# Patient Record
Sex: Male | Born: 1955 | Race: White | Hispanic: No | Smoking: Former smoker
Health system: Southern US, Community
[De-identification: ages and names within clinical notes are randomized; demographics above are authoritative.]

## PROBLEM LIST (undated history)

## (undated) DIAGNOSIS — J42 Unspecified chronic bronchitis: Secondary | ICD-10-CM

## (undated) DIAGNOSIS — M1990 Unspecified osteoarthritis, unspecified site: Secondary | ICD-10-CM

## (undated) DIAGNOSIS — I10 Essential (primary) hypertension: Secondary | ICD-10-CM

## (undated) HISTORY — DX: Unspecified osteoarthritis, unspecified site: M19.90

## (undated) HISTORY — PX: TOTAL HIP ARTHROPLASTY: SHX124

## (undated) HISTORY — PX: DENTAL SURGERY: SHX609

---

## 2004-10-15 ENCOUNTER — Emergency Department: Payer: Self-pay | Admitting: Emergency Medicine

## 2004-10-18 ENCOUNTER — Emergency Department: Payer: Self-pay | Admitting: Emergency Medicine

## 2004-10-20 ENCOUNTER — Emergency Department (HOSPITAL_COMMUNITY): Admission: EM | Admit: 2004-10-20 | Discharge: 2004-10-20 | Payer: Self-pay | Admitting: Emergency Medicine

## 2004-10-21 ENCOUNTER — Emergency Department (HOSPITAL_COMMUNITY): Admission: EM | Admit: 2004-10-21 | Discharge: 2004-10-21 | Payer: Self-pay | Admitting: Emergency Medicine

## 2005-09-29 ENCOUNTER — Emergency Department: Payer: Self-pay | Admitting: Emergency Medicine

## 2005-10-09 ENCOUNTER — Ambulatory Visit: Payer: Self-pay | Admitting: Gastroenterology

## 2007-08-23 ENCOUNTER — Emergency Department (HOSPITAL_COMMUNITY): Admission: EM | Admit: 2007-08-23 | Discharge: 2007-08-23 | Payer: Self-pay | Admitting: Emergency Medicine

## 2007-08-24 ENCOUNTER — Observation Stay (HOSPITAL_COMMUNITY): Admission: EM | Admit: 2007-08-24 | Discharge: 2007-08-25 | Payer: Self-pay | Admitting: Emergency Medicine

## 2010-07-10 NOTE — Discharge Summary (Signed)
Riley Anderson, Riley Anderson                  ACCOUNT NO.:  [id number]   MEDICAL RECORD NO.:  [id number]          PATIENT TYPE:  OBV   LOCATION:  5158                         FACILITY:  [facility]   PHYSICIAN:  Mick Sell, MD DATE OF BIRTH:  [birth year]   DATE OF ADMISSION:  08/24/2007  DATE OF DISCHARGE:  08/25/2007                               DISCHARGE SUMMARY   DISCHARGE DIAGNOSES:  1. Cellulitis on the left hand with abscess positive for      Staphylococcus aureus methicillin sensitive.  2. Tobacco abuse.  3. History of hypertension.   DISCHARGE MEDICATIONS:  1. NicoDerm 21 mg patch apply 1 patch under skin every 24 hours.  2. Doxycycline 100 mcg p.o. b.i.d.  3. Percocet 7.5/325/325 1-2 tablets by mouth every 6 hours as needed      for pain.   DISPOSITION AND FOLLOW-UP:  The patient had been discharged in stable  condition with a follow-up appointment scheduled with primary care  physician, Dr. [name] on September 01, 2007, at 10:30 in the morning and  that appointment is going to be really important to recheck the  patient's left hand to reassure that he is healing properly and also in  order for him to reestablish his relationship with primary care  physician.  Important to recheck blood pressure and adjust medications  in case that he needs to be on any.   PROCEDURES PERFORMED:  The patient had an incision and drainage of left  hand abscess.  Procedure was performed by Dr. Sampson Goon, and also  myself was attending on this procedure. There were no other procedures  performed during this admission.  There were no consultations history.   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  The patient had an incision and drainage on the  emergency department on Sunday, and he was sent home with doxycycline,  but the pain was so intense that the patient returned on Monday morning  with swelling getting into mid forearm and with real red, so the patient  was at this point admitted in order to give IV antibiotics, gave  medications for his pain and also in order to have another incision and  drainage plus culture of the absence in order to try to isolate  unspecific microorganisms.  The patient denies fever, denies chills,  denies nausea and has history of chronic cough secondary to smoking  habit.   PHYSICAL EXAMINATION:  VITAL SIGNS:  Temperature 97.6, blood pressure  125/86, heart rate 81, respiratory rate 14, oxygen saturation 99% on  room air.  GENERAL:  The patient was in no acute distress.  EYES:  PERRLA, no icterus, no nystagmus.  Extraocular muscles intact.  ENT:  Moist mucous membrane.  Oropharynx without erythema or plaques.  The patient with poor dentition.  Neck:  Supple.  No thyromegaly, no lymphadenopathy palpated.  No bruits.  RESPIRATORY SYSTEM:  Clear to auscultation bilaterally.  CARDIOVASCULAR:  Regular rate and rhythm, no murmur, gallops or rubs.  ABDOMEN:  Soft, positive bowel sounds, nontender, nondistended.  EXTREMITIES:  Regarding extremity exam, lower extremity showing no edema  or pain, no clubbing, no cyanosis.  On the left hand, the patient has a  4 x 4 cm abscess with minimal fluctuation plus erythema of his entire  hand up to his wrist and edema up to forearm.  The range of motion of  his left wrist is decreased secondary to the edema and the pain.  NEUROLOGIC:  The patient was alert, awake and oriented x3.  Neurologic  exam grossly nonfocal, completely normal, with cranial nerve II-XII  completely intact.  Finger-to-nose normal and muscle strength except for  the decreasing of the wrist extension 5/5  bilaterally in the 4 limbs.   LABS:  Sodium 137, potassium 4.7, chloride 104, bicarb 76, BUN 12,  creatinine 1.05, glucose 121, white blood cell of 12.2, hemoglobin 14,  platelet 264.  The patient has a bilirubin of 1.1, alkaline phosphatase  92, AST 21, ALT 17, albumin 4.3, calcium 9.2.  Culture abscess showed  nothing on the gram stain.  Culture was positive for moderate staph  aureus resistant to tetracycline.  It was MSSA.   HOSPITAL COURSE:  1. Cellulitis of the left hand and abscess.  The patient was admitted      and received IV vancomycin after we had the culture and he was      admitted pretty much for just pain control and 1 day of antibiotic      and in order to perform a second incision and drainage.  As soon as      this was done, the culture was back showing the sensitivity of the      microorganism.  The patient was switched from vancomycin to      doxycycline 100 mg by mouth twice a day in order for him to      complete a course of 14 days.  The patient is going to be followed      by primary care physician, Dr. [name] on September 01, 2007, at 10:30      in the morning.  2. Tobacco abuse.  The patient received counseling for smoking      cessation and he was also offered nicotine patch to use in order to      help him stop smoking.  After the discussion, the patient agreed to      use patch and decided to go with a prescription as an outpatient to      continue using the patch and try to stop smoking.  3. The history of hypertension.  The patient's blood pressure during      this whole hospitalization have been completely in the normal      range.  He is taking no medications at all.  We decided to schedule      a follow-up appointment with primary care physician where he is      noted to only have the cellulitis of his left hand and abscess      checkup.  He is going also to reestablish his medical physician      relationship for regular follow-up and his blood pressure  is going  to be checked again, in case that this elevated, the patient is      going to receive the appropriate treatment.   At the moment of discharge, the patient vital signs, temperature 97.6,  blood pressure 135/88, heart rate 75, respiratory rate 16, oxygen  saturation 98 on room air.  White blood cells 8.1, hemoglobin 13.4,  platelet 239.  We have a sodium 139, potassium 4.2, chloride 106, bicarb  30, BUN 11, creatinine 1.0, glucose 116. The patient was discharged in  stable condition with instructions to complete p.o. antibiotics for 14  days and also instructions to use Percocet for pain control.      Rosanna Randy, MD  Electronically Signed      Mick Sell, MD  Electronically Signed   CEM/MEDQ  D:  08/25/2007  T:  08/26/2007  Job:  [id number] [id number]   cc:   Loma Sender

## 2010-11-22 LAB — CULTURE, ROUTINE-ABSCESS: Gram Stain: NONE SEEN

## 2010-11-22 LAB — DIFFERENTIAL
Lymphocytes Relative: 15
Lymphs Abs: 1.9
Monocytes Absolute: 1.1 — ABNORMAL HIGH
Monocytes Relative: 9
Neutrophils Relative %: 72

## 2010-11-22 LAB — COMPREHENSIVE METABOLIC PANEL
AST: 21
Albumin: 4.3
GFR calc Af Amer: >60
Glucose, Bld: 121 — ABNORMAL HIGH
Potassium: 4.7

## 2010-11-22 LAB — BASIC METABOLIC PANEL
CO2: 30
Calcium: 9
Chloride: 106
GFR calc Af Amer: >60
GFR calc non Af Amer: >60
Glucose, Bld: 116 — ABNORMAL HIGH
Potassium: 4.2
Sodium: 139

## 2010-11-22 LAB — CBC
HCT: 38.9 — ABNORMAL LOW
HCT: 40.6
Hemoglobin: 14
MCHC: 34.4
MCHC: 34.5
MCV: 89.8
MCV: 90.3
RDW: 13.5
WBC: 8.1

## 2012-03-25 ENCOUNTER — Emergency Department: Payer: Self-pay | Admitting: Emergency Medicine

## 2012-03-28 ENCOUNTER — Emergency Department (HOSPITAL_COMMUNITY): Payer: Self-pay

## 2012-03-28 ENCOUNTER — Encounter (HOSPITAL_COMMUNITY): Payer: Self-pay | Admitting: Emergency Medicine

## 2012-03-28 ENCOUNTER — Emergency Department (HOSPITAL_COMMUNITY)
Admission: EM | Admit: 2012-03-28 | Discharge: 2012-03-28 | Disposition: A | Discharge status: Home Health | Payer: Self-pay | Attending: Emergency Medicine | Admitting: Emergency Medicine

## 2012-03-28 DIAGNOSIS — K047 Periapical abscess without sinus: Secondary | ICD-10-CM | POA: Insufficient documentation

## 2012-03-28 DIAGNOSIS — F17200 Nicotine dependence, unspecified, uncomplicated: Secondary | ICD-10-CM | POA: Insufficient documentation

## 2012-03-28 LAB — POCT I-STAT, CHEM 8
BUN: 20 mg/dL (ref 6–23)
Calcium, Ion: 1.24 mmol/L — ABNORMAL HIGH (ref 1.12–1.23)
Chloride: 105 mEq/L (ref 96–112)
Creatinine, Ser: 1.1 mg/dL (ref 0.50–1.35)
HCT: 43 % (ref 39.0–52.0)
Potassium: 4.1 mEq/L (ref 3.5–5.1)

## 2012-03-28 MED ORDER — IOHEXOL 300 MG/ML  SOLN
100.0000 mL | Freq: Once | INTRAMUSCULAR | Status: AC | PRN
  Administered 2012-03-28: 100 mL via INTRAVENOUS

## 2012-03-28 MED ORDER — METRONIDAZOLE 500 MG PO TABS: 500.0000 mg | ORAL_TABLET | Freq: Three times a day (TID) | ORAL | Status: DC

## 2012-03-28 MED ORDER — PENICILLIN V POTASSIUM 500 MG PO TABS: 500.0000 mg | ORAL_TABLET | Freq: Four times a day (QID) | ORAL | Status: AC

## 2012-03-28 MED ORDER — OXYCODONE-ACETAMINOPHEN 5-325 MG PO TABS: 2.0000 | ORAL_TABLET | ORAL | Status: DC | PRN

## 2012-03-28 NOTE — ED Provider Notes (Signed)
History     CSN: [id number]  Arrival date & time 03/28/12  1147   First MD Initiated Contact with Patient 03/28/12 1210      Chief Complaint  Patient presents with  . Oral Swelling    (Consider location/radiation/quality/duration/timing/severity/associated sxs/prior treatment) HPI Comments: Pain and swelling to the left jaw for the past week. Denies trauma. Saw dentist and is placed on amoxicillin she's been taking for the past week. he states is not working. No difficulty breathing or swallowing. No fevers, chills or vomiting. No chest pain or shortness of breath.  The history is provided by the patient.    History reviewed. No pertinent past medical history.  History reviewed. No pertinent past surgical history.  No family history on file.  History  Substance Use Topics  . Smoking status: Current Some Day Smoker -- 1.0 packs/day    Types: Cigarettes  . Smokeless tobacco: Not on file  . Alcohol Use: No      Review of Systems  Constitutional: Negative for fever.  HENT: Positive for facial swelling and dental problem. Negative for trouble swallowing, neck pain and neck stiffness.   Respiratory: Negative for cough and shortness of breath.   Cardiovascular: Negative for chest pain.  Gastrointestinal: Negative for nausea, vomiting and abdominal pain.  A complete 10 system review of systems was obtained and all systems are negative except as noted in the HPI and PMH.    Allergies  Other and Sudafed  Home Medications  No current outpatient prescriptions on file.  BP 139/81  Pulse 84  Temp 98 F (36.7 C) (Oral)  Resp 20  SpO2 100%  Physical Exam  Constitutional: He is oriented to person, place, and time. He appears well-developed. No distress.  HENT:  Head: Normocephalic and atraumatic.    Mouth/Throat: Oropharynx is clear and moist. No oropharyngeal exudate.       Firm 2 cm tender nodule to the angle of the left jaw. Patient has no teeth in the upper jaw and  only incisors in the lower jaw. Floor of mouth is soft. There is no trismus protime elevation. Controlling secretions. No palpable fluctuance inside the mouth.  Eyes: Conjunctivae normal and EOM are normal. Pupils are equal, round, and reactive to light.  Neck: Normal range of motion. Neck supple.       No meningismus  Cardiovascular: Normal rate, regular rhythm and normal heart sounds.   No murmur heard. Pulmonary/Chest: Effort normal and breath sounds normal. No respiratory distress.  Abdominal: Soft. There is no tenderness. There is no rebound and no guarding.  Musculoskeletal: Normal range of motion. He exhibits no edema and no tenderness.  Neurological: He is alert and oriented to person, place, and time. No cranial nerve deficit. Coordination normal.  Skin: Skin is warm.    ED Course  Procedures (including critical care time)  Labs Reviewed  POCT I-STAT, CHEM 8 - Abnormal; Notable for the following:    Glucose, Bld 108 (*)     Calcium, Ion 1.24 (*)     All other components within normal limits   Ct Maxillofacial W/cm  03/28/2012  *RADIOLOGY REPORT*  Clinical Data: Pain and swelling  CT MAXILLOFACIAL WITH CONTRAST  Technique:  Multidetector CT imaging of the maxillofacial structures was performed with intravenous contrast. Multiplanar CT image reconstructions were also generated.  Contrast: OMNIPAQUE IOHEXOL 300 MG/ML  SOLN  Comparison: None.  Findings: Periapical abscess involving tooth number 20 with erosion through the mandible and small subperiosteal abscess.  There is a somewhat irregular peripheral enhancing 2.3 cm deep subcutaneous abscess anterior to the left masseter musculature.  There is mild edema in the left masseter musculature.  No adenopathy is evident.  The paranasal sinuses are normally developed well aerated.  Temporomandibular joints seated. Retention cysts or polyps in bilateral maxillary sinuses. Visualized intracranial contents unremarkable.  Orbits and globes  unremarkable.  Multiple missing teeth and dental caries.  IMPRESSION:  1.  Periapical abscess of tooth number 20 complicated by subperiosteal and deep subcutaneous abscess as above.   Original Report Authenticated By: D. Andria Rhein, MD      No diagnosis found.    MDM  Apparent dental abscess without systemic symptoms. No evidence of Ludwig's angina.  Patient's vitals are stable. He is controlling his secretions he has no difficulty breathing or swallowing.  Imaging remarkable for periApical abscess tooth 20 with periosteal and deep subcutaneous abscess.  Discussed with Dr. Chales Salmon. He recommends adding Flagyl to patient's amoxicillin. He will followup the patient in the office on Monday for drainage.      Glynn Octave, MD 03/28/12 1426

## 2012-03-28 NOTE — ED Notes (Signed)
Pt. Stated, i have this place on my rt. Jaw for a week and it started cause I had a bad tooth . I'm taking an antibiotic for it but its not working.  It moves around and i'm worried about what it might do.

## 2012-03-28 NOTE — ED Notes (Signed)
Pt discharged to home with family 

## 2013-08-27 IMAGING — CT CT MAXILLOFACIAL W/ CM
1 series · 16 of 30 positions shown, 20 images · IV contrast (omnipaque)
Comparison: None.

CLINICAL DATA: Pain and swelling

CT MAXILLOFACIAL WITH CONTRAST
TECHNIQUE: Multidetector CT imaging of the maxillofacial
structures was performed with intravenous contrast. Multiplanar CT
image reconstructions were also generated.
Contrast: 100mL OMNIPAQUE IOHEXOL 300 MG/ML  SOLN

[Series 4: orbit/facial 2.0 h30s · axial · 0.37mm/px · z∈[-312,-154]mm · 16 of 85 slices shown, 20 images]
[im 3/85  brain]
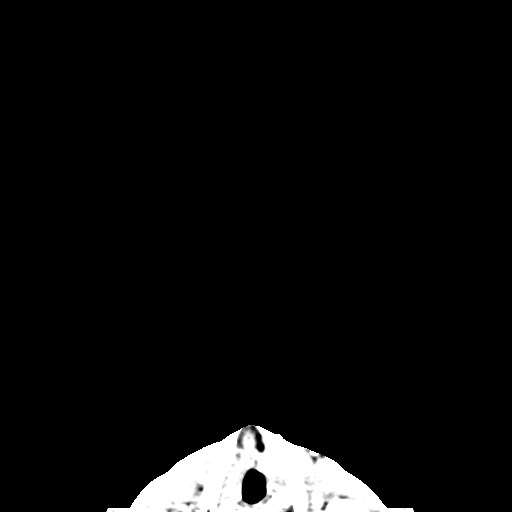
[im 3/85  bone]
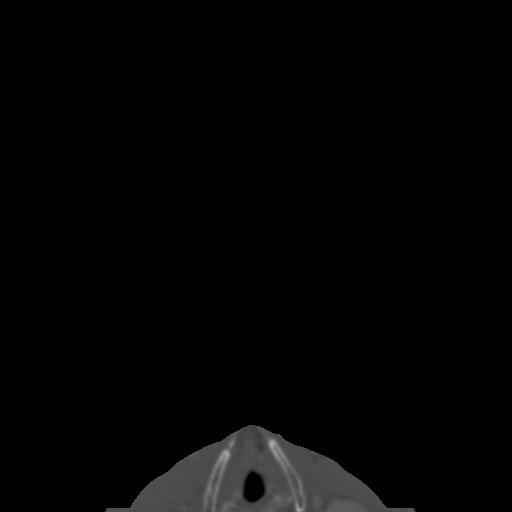
[im 9/85  bone]
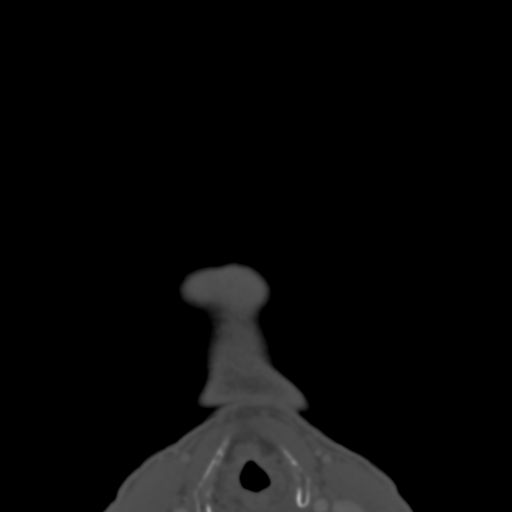
[im 15/85  bone]
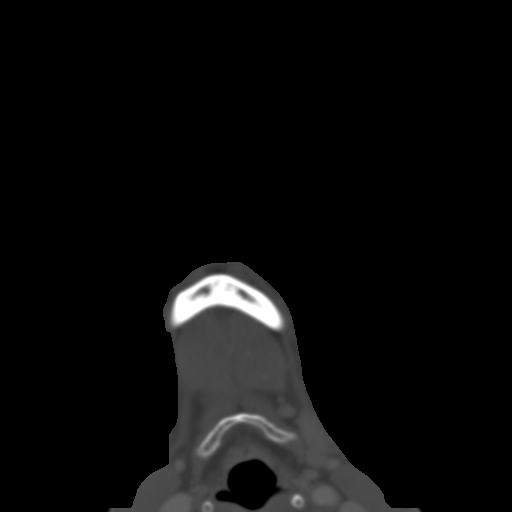
[im 21/85  bone]
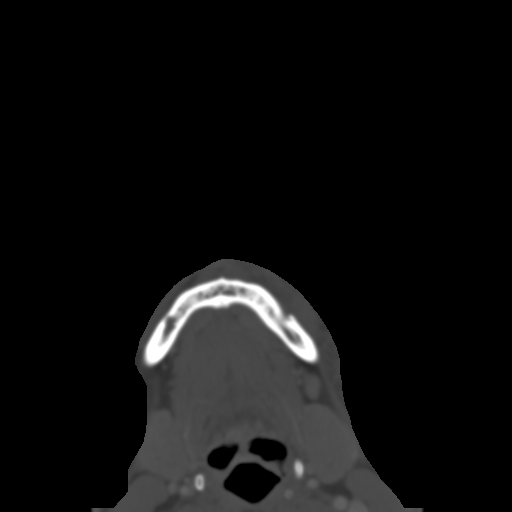
[im 24/85  brain]
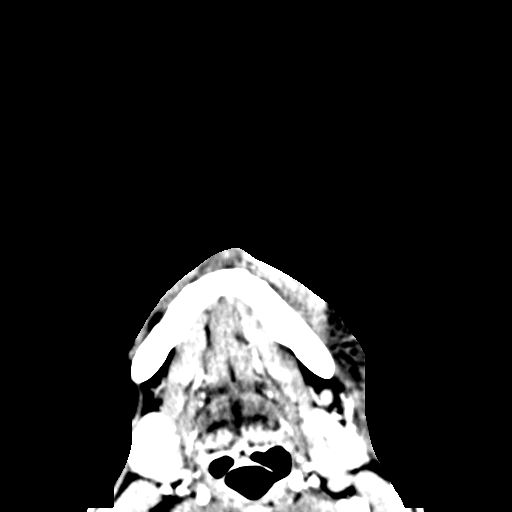
[im 24/85  bone]
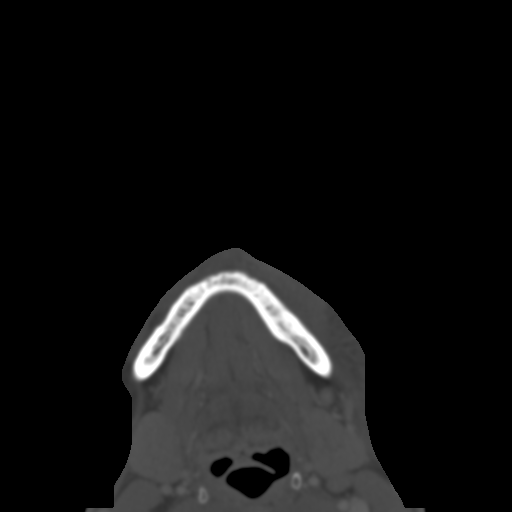
[im 29/85  bone]
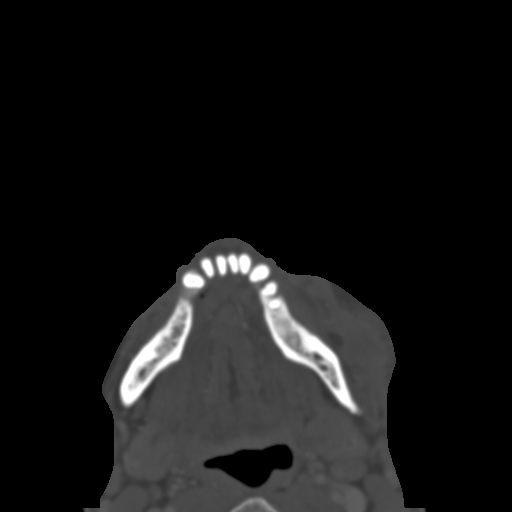
[im 35/85  bone]
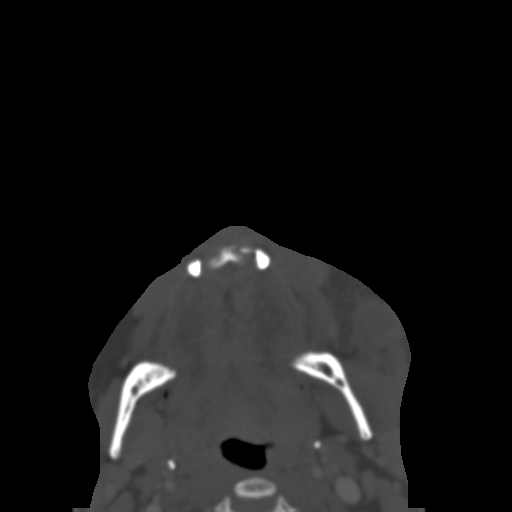
[im 41/85  bone]
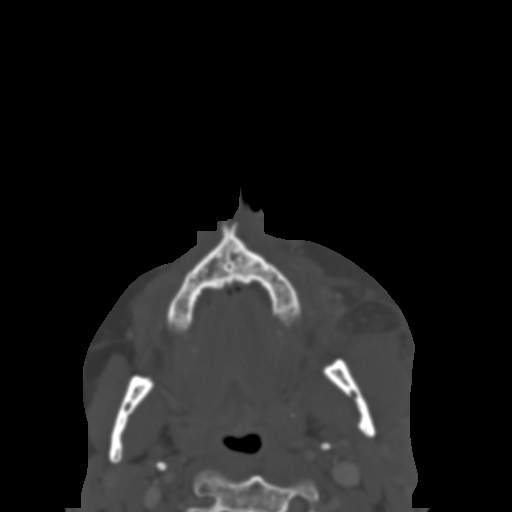
[im 44/85  brain]
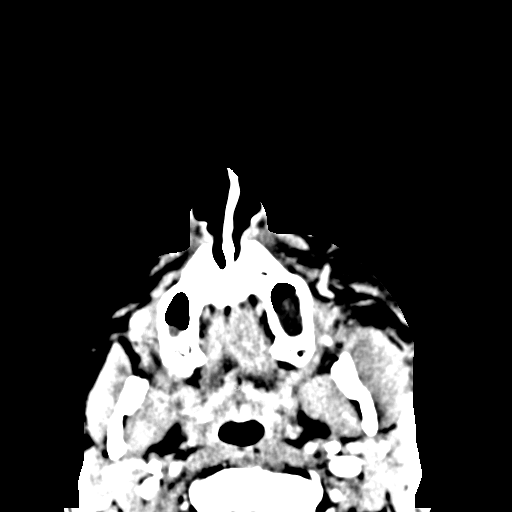
[im 44/85  bone]
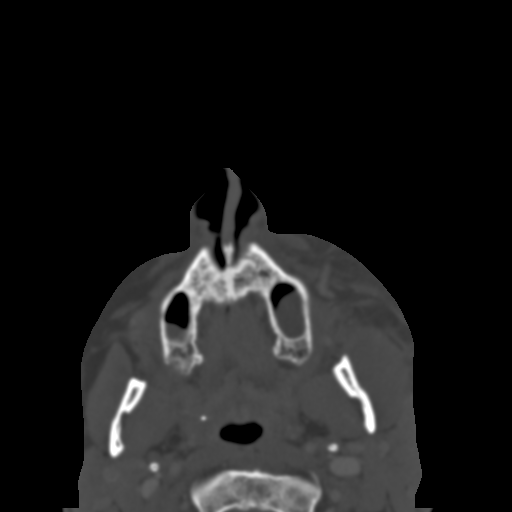
[im 50/85  bone]
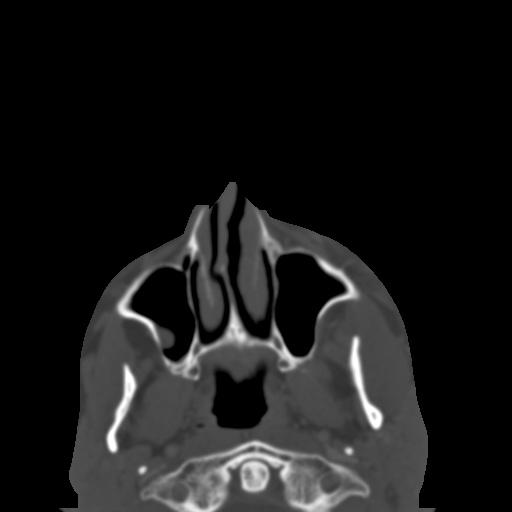
[im 56/85  bone]
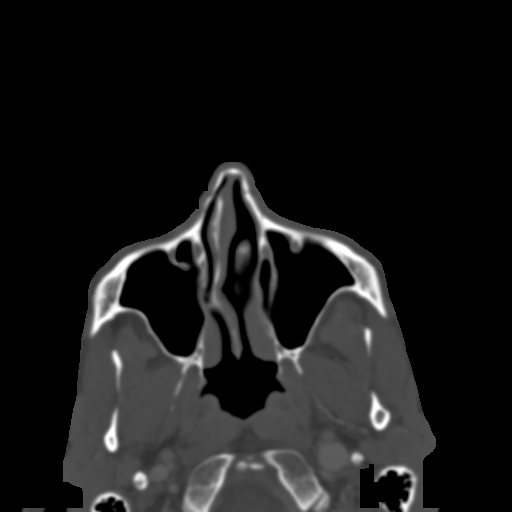
[im 61/85  bone]
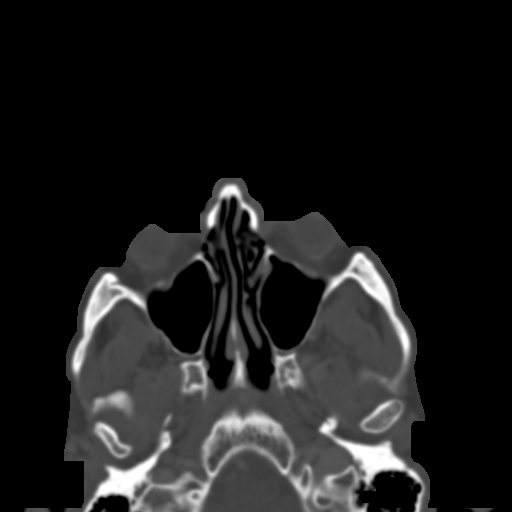
[im 64/85  brain]
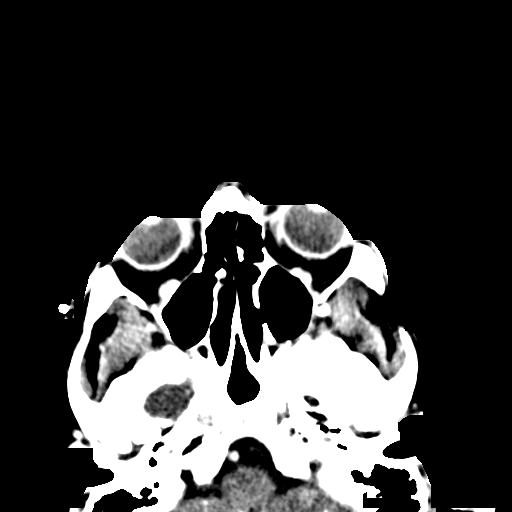
[im 64/85  bone]
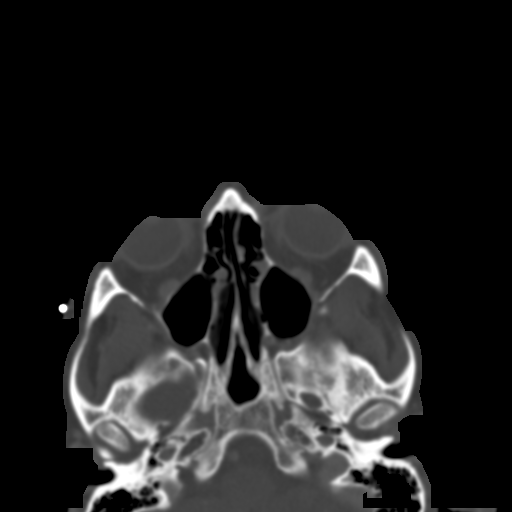
[im 70/85  bone]
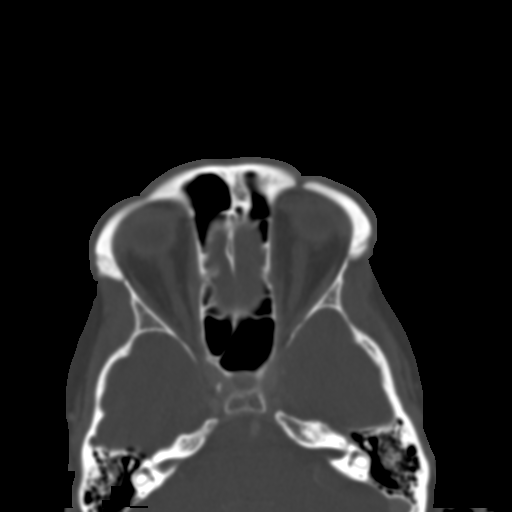
[im 76/85  bone]
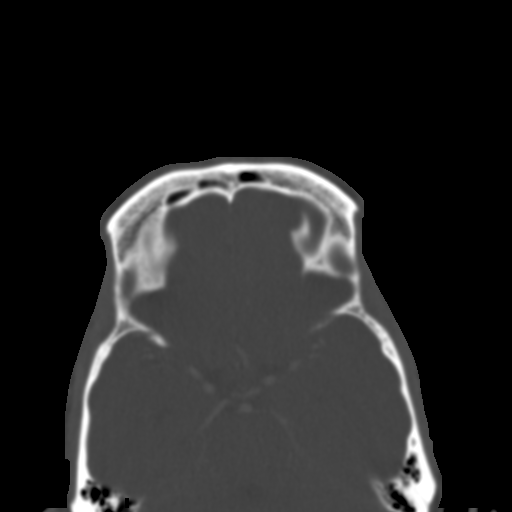
[im 82/85  bone]
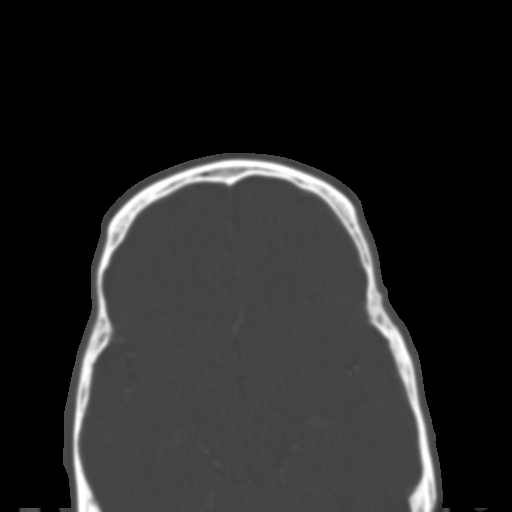

[16 of 30 positions shown; findings below may reference images not displayed]

FINDINGS: Periapical abscess involving tooth number 20 with erosion
through the mandible and small subperiosteal abscess.  There is a
somewhat irregular peripheral enhancing 2.3 cm deep subcutaneous
abscess anterior to the left masseter musculature.  There is mild
edema in the left masseter musculature.

No adenopathy is evident.  The paranasal sinuses are normally
developed well aerated.  Temporomandibular joints seated.
Retention cysts or polyps in bilateral maxillary sinuses.
Visualized intracranial contents unremarkable.  Orbits and globes
unremarkable.

Multiple missing teeth and dental caries.
IMPRESSION: 1.  Periapical abscess of tooth number 20 complicated by
subperiosteal and deep subcutaneous abscess as above.

## 2014-04-14 ENCOUNTER — Emergency Department (HOSPITAL_COMMUNITY)
Admission: EM | Admit: 2014-04-14 | Discharge: 2014-04-14 | Disposition: A | Discharge status: Home / Self Care | Payer: Self-pay | Attending: Emergency Medicine | Admitting: Emergency Medicine

## 2014-04-14 ENCOUNTER — Encounter (HOSPITAL_COMMUNITY): Payer: Self-pay

## 2014-04-14 DIAGNOSIS — R109 Unspecified abdominal pain: Secondary | ICD-10-CM

## 2014-04-14 DIAGNOSIS — R319 Hematuria, unspecified: Secondary | ICD-10-CM

## 2014-04-14 DIAGNOSIS — N3001 Acute cystitis with hematuria: Secondary | ICD-10-CM | POA: Insufficient documentation

## 2014-04-14 DIAGNOSIS — Z79899 Other long term (current) drug therapy: Secondary | ICD-10-CM | POA: Insufficient documentation

## 2014-04-14 DIAGNOSIS — I10 Essential (primary) hypertension: Secondary | ICD-10-CM | POA: Insufficient documentation

## 2014-04-14 DIAGNOSIS — R112 Nausea with vomiting, unspecified: Secondary | ICD-10-CM | POA: Insufficient documentation

## 2014-04-14 DIAGNOSIS — Z720 Tobacco use: Secondary | ICD-10-CM | POA: Insufficient documentation

## 2014-04-14 HISTORY — DX: Essential (primary) hypertension: I10

## 2014-04-14 LAB — CBC WITH DIFFERENTIAL/PLATELET
Basophils Absolute: 0 10*3/uL (ref 0.0–0.1)
Basophils Relative: 0 % (ref 0–1)
Eosinophils Absolute: 0.1 10*3/uL (ref 0.0–0.7)
Eosinophils Relative: 1 % (ref 0–5)
HCT: 41.7 % (ref 39.0–52.0)
Hemoglobin: 14 g/dL (ref 13.0–17.0)
Lymphocytes Relative: 9 % — ABNORMAL LOW (ref 12–46)
Lymphs Abs: 1 10*3/uL (ref 0.7–4.0)
MCH: 29.5 pg (ref 26.0–34.0)
MCHC: 33.6 g/dL (ref 30.0–36.0)
MCV: 87.8 fL (ref 78.0–100.0)
Monocytes Absolute: 0.6 10*3/uL (ref 0.1–1.0)
Monocytes Relative: 6 % (ref 3–12)
Neutro Abs: 9 10*3/uL — ABNORMAL HIGH (ref 1.7–7.7)
Neutrophils Relative %: 84 % — ABNORMAL HIGH (ref 43–77)
Platelets: 300 10*3/uL (ref 150–400)
RBC: 4.75 MIL/uL (ref 4.22–5.81)
RDW: 13.3 % (ref 11.5–15.5)
WBC: 10.7 10*3/uL — ABNORMAL HIGH (ref 4.0–10.5)

## 2014-04-14 LAB — URINALYSIS, ROUTINE W REFLEX MICROSCOPIC
Bilirubin Urine: NEGATIVE
Glucose, UA: NEGATIVE mg/dL
Ketones, ur: NEGATIVE mg/dL
Leukocytes, UA: NEGATIVE
Nitrite: NEGATIVE
Protein, ur: 30 mg/dL — AB
Specific Gravity, Urine: 1.015 (ref 1.005–1.030)
Urobilinogen, UA: 0.2 mg/dL (ref 0.0–1.0)
pH: 5 (ref 5.0–8.0)

## 2014-04-14 LAB — COMPREHENSIVE METABOLIC PANEL
ALT: 24 U/L (ref 0–53)
AST: 24 U/L (ref 0–37)
Albumin: 4.3 g/dL (ref 3.5–5.2)
Alkaline Phosphatase: 81 U/L (ref 39–117)
Anion gap: 7 (ref 5–15)
BUN: 16 mg/dL (ref 6–23)
CO2: 25 mmol/L (ref 19–32)
Calcium: 9.6 mg/dL (ref 8.4–10.5)
Chloride: 107 mmol/L (ref 96–112)
Creatinine, Ser: 1.16 mg/dL (ref 0.50–1.35)
GFR calc Af Amer: 79 mL/min — ABNORMAL LOW (ref >90)
GFR calc non Af Amer: 68 mL/min — ABNORMAL LOW (ref >90)
Glucose, Bld: 128 mg/dL — ABNORMAL HIGH (ref 70–99)
Potassium: 4.2 mmol/L (ref 3.5–5.1)
Sodium: 139 mmol/L (ref 135–145)
Total Bilirubin: 0.6 mg/dL (ref 0.3–1.2)
Total Protein: 6.7 g/dL (ref 6.0–8.3)

## 2014-04-14 LAB — URINE MICROSCOPIC-ADD ON

## 2014-04-14 LAB — LIPASE, BLOOD: Lipase: 28 U/L (ref 11–59)

## 2014-04-14 MED ORDER — OXYCODONE-ACETAMINOPHEN 5-325 MG PO TABS: 1.0000 | ORAL_TABLET | Freq: Four times a day (QID) | ORAL | Status: DC | PRN

## 2014-04-14 MED ORDER — CIPROFLOXACIN HCL 500 MG PO TABS
500.0000 mg | ORAL_TABLET | Freq: Once | ORAL | Status: AC
  Administered 2014-04-14: 500 mg via ORAL
  Filled 2014-04-14: qty 1

## 2014-04-14 MED ORDER — ONDANSETRON 4 MG PO TBDP: 8.0000 mg | ORAL_TABLET | Freq: Once | ORAL | Status: DC

## 2014-04-14 MED ORDER — IBUPROFEN 800 MG PO TABS: 800.0000 mg | ORAL_TABLET | Freq: Three times a day (TID) | ORAL | Status: DC

## 2014-04-14 MED ORDER — CIPROFLOXACIN HCL 500 MG PO TABS: 500.0000 mg | ORAL_TABLET | Freq: Two times a day (BID) | ORAL | Status: DC

## 2014-04-14 NOTE — Discharge Instructions (Signed)
Please read and follow all provided instructions.  Your diagnoses today include:  1. Right flank pain   2. Hematuria   3. Acute cystitis with hematuria     Tests performed today include:  Urine test that showed blood in your urine and an infection  Blood test that showed normal kidney function  Vital signs. See below for your results today.   Medications prescribed:   Ciprofloxacin - antibiotic  You have been prescribed an antibiotic medicine: take the entire course of medicine even if you are feeling better. Stopping early can cause the antibiotic not to work.   Percocet (oxycodone/acetaminophen) - narcotic pain medication  DO NOT drive or perform any activities that require you to be awake and alert because this medicine can make you drowsy. BE VERY CAREFUL not to take multiple medicines containing Tylenol (also called acetaminophen). Doing so can lead to an overdose which can damage your liver and cause liver failure and possibly death.  Take any prescribed medications only as directed.  Home care instructions:  Follow any educational materials contained in this packet.  Please double your fluid intake for the next several days.   BE VERY CAREFUL not to take multiple medicines containing Tylenol (also called acetaminophen). Doing so can lead to an overdose which can damage your liver and cause liver failure and possibly death.   Follow-up instructions: Please follow-up with the urologist referral (provided on front page) in the next 3 days for further evaluation of your symptoms.  Return instructions:  Please return to the Emergency Department if you experience worsening symptoms.  Please return if you develop fever or uncontrolled pain or vomiting.  Please return if you have any other emergent concerns.  Additional Information:  Your vital signs today were: BP 140/88 mmHg   Pulse 64   Temp(Src) 97.9 F (36.6 C) (Oral)   Resp 18   SpO2 97% If your blood pressure  (BP) was elevated above 135/85 this visit, please have this repeated by your doctor within one month. --------------

## 2014-04-14 NOTE — ED Notes (Signed)
Pt presents with onset of nausea, vomiting and abdominal pain.  Pt reports he had just finished eating a honey bun with onset of R sided abdominal pain that is now generalized.  Last bowel movement was last night.

## 2014-04-14 NOTE — ED Provider Notes (Signed)
CSN: [id number]     Arrival date & time 04/14/14  1154 History   First MD Initiated Contact with Patient 04/14/14 1302     No chief complaint on file.    (Consider location/radiation/quality/duration/timing/severity/associated sxs/prior Treatment) HPI Comments: Patient with no past surgical history, remote history of 'bleeding ulcers' -- presents with c/o abdominal pain starting in RUQ , radiating around to the back and then down to the groin starting at 10 AM and lasting for about 4 hours. Patient states that the pain began right after eating a honey bun and drinking coffee. Patient vomited 4 times. Pain persisted for approximately 3 hours and resolved upon emergency department arrival. Patient denies fevers, URI symptoms, chest pain, shortness of breath. No bowel movement today however patient has not had any diarrhea. No urinary symptoms including hematuria or dysuria. Patient denies alcohol use. He takes a BC powder approximately once every 2 weeks. He does smoke. No other treatments prior to arrival. The onset of this condition was acute. The course is resolved. Aggravating factors: none. Alleviating factors: none.    The history is provided by the patient.    Past Medical History  Diagnosis Date  . Hypertension    Past Surgical History  Procedure Laterality Date  . Dental surgery     History reviewed. No pertinent family history. History  Substance Use Topics  . Smoking status: Current Some Day Smoker -- 1.00 packs/day    Types: Cigarettes  . Smokeless tobacco: Not on file  . Alcohol Use: No    Review of Systems  Constitutional: Negative for fever.  HENT: Negative for rhinorrhea and sore throat.   Eyes: Negative for redness.  Respiratory: Negative for cough.   Cardiovascular: Negative for chest pain.  Gastrointestinal: Positive for nausea, vomiting and abdominal pain. Negative for diarrhea and blood in stool.  Genitourinary: Negative for dysuria.  Musculoskeletal:  Negative for myalgias.  Skin: Negative for rash.  Neurological: Negative for headaches.      Allergies  Other and Sudafed  Home Medications   Prior to Admission medications   Medication Sig Start Date End Date Taking? Authorizing Provider  metroNIDAZOLE (FLAGYL) 500 MG tablet Take 1 tablet (500 mg total) by mouth 3 (three) times daily. 03/28/12   Glynn Octave, MD  oxyCODONE-acetaminophen (PERCOCET/ROXICET) 5-325 MG per tablet Take 2 tablets by mouth every 4 (four) hours as needed for pain. 03/28/12   Glynn Octave, MD   BP 152/106 mmHg  Pulse 68  Temp(Src) 97.9 F (36.6 C) (Oral)  Resp 18  SpO2 100%   Physical Exam  Constitutional: He appears well-developed and well-nourished.  HENT:  Head: Normocephalic and atraumatic.  Eyes: Conjunctivae are normal. Right eye exhibits no discharge. Left eye exhibits no discharge.  Neck: Normal range of motion. Neck supple.  Cardiovascular: Normal rate, regular rhythm and normal heart sounds.   Pulmonary/Chest: Effort normal and breath sounds normal.  Abdominal: Soft. There is no tenderness.  Neurological: He is alert.  Skin: Skin is warm and dry.  Psychiatric: He has a normal mood and affect.  Nursing note and vitals reviewed.   ED Course  Procedures (including critical care time) Labs Review Labs Reviewed  CBC WITH DIFFERENTIAL/PLATELET - Abnormal; Notable for the following:    WBC 10.7 (*)    Neutrophils Relative % 84 (*)    Neutro Abs 9.0 (*)    Lymphocytes Relative 9 (*)    All other components within normal limits  COMPREHENSIVE METABOLIC PANEL - Abnormal; Notable for  the following:    Glucose, Bld 128 (*)    GFR calc non Af Amer 68 (*)    GFR calc Af Amer 79 (*)    All other components within normal limits  URINALYSIS, ROUTINE W REFLEX MICROSCOPIC - Abnormal; Notable for the following:    Color, Urine AMBER (*)    APPearance CLOUDY (*)    Hgb urine dipstick LARGE (*)    Protein, ur 30 (*)    All other components  within normal limits  URINE MICROSCOPIC-ADD ON - Abnormal; Notable for the following:    Bacteria, UA MANY (*)    All other components within normal limits  URINE CULTURE  LIPASE, BLOOD    Imaging Review No results found.   EKG Interpretation None       1:50 PM Patient seen and examined. Work-up initiated. Pending UA. Symptoms completely resolved.    Vital signs reviewed and are as follows: BP 152/106 mmHg  Pulse 68  Temp(Src) 97.9 F (36.6 C) (Oral)  Resp 18  SpO2 100%  UA demonstrated blood and probable infection. Urine culture sent.  Patient informed of results. Upon returning to the room, the patient is dressed and ready to go home. We discussed the UA findings. He states that he has had blood in his urine in the past. He never has had kidney stone. He does not one further evaluation for kidney stone at this time. His pain remained resolved. I believe that his presentation is suggestive of a stone. Given urinary tract infection will give course of Cipro. First dose given in emergency department. He is unable to take Percocet. Will discharge to home with ibuprofen for pain. Patient encouraged to follow-up with urologist in the next week for painless hematuria possible stone. Family states that they will ensure that he follows up. Patient told to return to emergency department with fever, worsening pain. He verbalizes understanding and agrees with plan.  MDM   Final diagnoses:  Right flank pain  Hematuria  Acute cystitis with hematuria   Patient with 4 hours of right upper abdomen and flank pain with radiation to the groin. Pain is now resolved. UA demonstrates blood and possible infection. Patient does not appear toxic or septic. His white count is normal. His kidney function is normal. His pain is currently resolved and do not feel that imaging is necessary and patient does not want this regardless. He's been ready to leave since the first exam. Will treat urinary tract  infection with Cipro. Culture sent. Urology follow-up given. Appropriate return instructions given to patient.  No dangerous or life-threatening conditions suspected or identified by history, physical exam, and by work-up. No indications for hospitalization identified.      Renne CriglerJoshua Emmalie Haigh, PA-C 04/14/14 1629  Raeford RazorStephen Kohut, MD 04/16/14 [phone]

## 2014-04-15 LAB — URINE CULTURE
COLONY COUNT: NO GROWTH
CULTURE: NO GROWTH
Special Requests: NORMAL

## 2016-04-07 ENCOUNTER — Emergency Department (HOSPITAL_COMMUNITY): Payer: Self-pay

## 2016-04-07 ENCOUNTER — Emergency Department (HOSPITAL_COMMUNITY): Payer: Self-pay | Admitting: Anesthesiology

## 2016-04-07 ENCOUNTER — Ambulatory Visit (HOSPITAL_COMMUNITY)
Admission: EM | Admit: 2016-04-07 | Discharge: 2016-04-07 | Disposition: A | Discharge status: Home / Self Care | Payer: Self-pay | Attending: Emergency Medicine | Admitting: Emergency Medicine

## 2016-04-07 ENCOUNTER — Encounter (HOSPITAL_COMMUNITY): Payer: Self-pay | Admitting: Emergency Medicine

## 2016-04-07 ENCOUNTER — Encounter (HOSPITAL_COMMUNITY)
Admission: EM | Disposition: A | Discharge status: Home / Self Care | Payer: Self-pay | Source: Home / Self Care | Attending: Emergency Medicine

## 2016-04-07 DIAGNOSIS — Z888 Allergy status to other drugs, medicaments and biological substances status: Secondary | ICD-10-CM | POA: Insufficient documentation

## 2016-04-07 DIAGNOSIS — Z9889 Other specified postprocedural states: Secondary | ICD-10-CM | POA: Insufficient documentation

## 2016-04-07 DIAGNOSIS — I10 Essential (primary) hypertension: Secondary | ICD-10-CM | POA: Insufficient documentation

## 2016-04-07 DIAGNOSIS — F17210 Nicotine dependence, cigarettes, uncomplicated: Secondary | ICD-10-CM | POA: Insufficient documentation

## 2016-04-07 DIAGNOSIS — S61213A Laceration without foreign body of left middle finger without damage to nail, initial encounter: Secondary | ICD-10-CM | POA: Insufficient documentation

## 2016-04-07 DIAGNOSIS — Z885 Allergy status to narcotic agent status: Secondary | ICD-10-CM | POA: Insufficient documentation

## 2016-04-07 DIAGNOSIS — S61210A Laceration without foreign body of right index finger without damage to nail, initial encounter: Secondary | ICD-10-CM | POA: Insufficient documentation

## 2016-04-07 DIAGNOSIS — X58XXXA Exposure to other specified factors, initial encounter: Secondary | ICD-10-CM | POA: Insufficient documentation

## 2016-04-07 HISTORY — PX: I & D EXTREMITY: SHX5045

## 2016-04-07 LAB — COMPREHENSIVE METABOLIC PANEL
ALBUMIN: 3.9 g/dL (ref 3.5–5.0)
ALT: 15 U/L — ABNORMAL LOW (ref 17–63)
ANION GAP: 14 (ref 5–15)
AST: 29 U/L (ref 15–41)
Alkaline Phosphatase: 71 U/L (ref 38–126)
BUN: 23 mg/dL — ABNORMAL HIGH (ref 6–20)
CALCIUM: 9.7 mg/dL (ref 8.9–10.3)
CHLORIDE: 103 mmol/L (ref 101–111)
CO2: 23 mmol/L (ref 22–32)
Creatinine, Ser: 1.01 mg/dL (ref 0.61–1.24)
GFR calc Af Amer: >60 mL/min (ref >60)
GFR calc non Af Amer: >60 mL/min (ref >60)
GLUCOSE: 144 mg/dL — AB (ref 65–99)
POTASSIUM: 4.3 mmol/L (ref 3.5–5.1)
Sodium: 140 mmol/L (ref 135–145)
Total Bilirubin: 0.7 mg/dL (ref 0.3–1.2)
Total Protein: 6 g/dL — ABNORMAL LOW (ref 6.5–8.1)

## 2016-04-07 LAB — CBC
HCT: 41.1 % (ref 39.0–52.0)
Hemoglobin: 13.4 g/dL (ref 13.0–17.0)
MCH: 29.9 pg (ref 26.0–34.0)
MCHC: 32.6 g/dL (ref 30.0–36.0)
MCV: 91.7 fL (ref 78.0–100.0)
PLATELETS: 317 10*3/uL (ref 150–400)
RBC: 4.48 MIL/uL (ref 4.22–5.81)
RDW: 13.6 % (ref 11.5–15.5)
WBC: 6.8 10*3/uL (ref 4.0–10.5)

## 2016-04-07 SURGERY — IRRIGATION AND DEBRIDEMENT EXTREMITY
Anesthesia: General | Site: Hand | Laterality: Right

## 2016-04-07 MED ORDER — CHLORHEXIDINE GLUCONATE 4 % EX LIQD: 60.0000 mL | Freq: Once | CUTANEOUS | Status: DC

## 2016-04-07 MED ORDER — LACTATED RINGERS IV SOLN
INTRAVENOUS | Status: DC
  Administered 2016-04-07: 09:00:00 via INTRAVENOUS

## 2016-04-07 MED ORDER — PROPOFOL 10 MG/ML IV BOLUS
INTRAVENOUS | Status: AC
  Filled 2016-04-07: qty 20

## 2016-04-07 MED ORDER — MORPHINE SULFATE (PF) 4 MG/ML IV SOLN
4.0000 mg | Freq: Once | INTRAVENOUS | Status: AC
  Administered 2016-04-07: 4 mg via INTRAVENOUS
  Filled 2016-04-07: qty 1

## 2016-04-07 MED ORDER — LIDOCAINE HCL (CARDIAC) 20 MG/ML IV SOLN
INTRAVENOUS | Status: DC | PRN
  Administered 2016-04-07: 60 mg via INTRAVENOUS

## 2016-04-07 MED ORDER — FENTANYL CITRATE (PF) 100 MCG/2ML IJ SOLN
INTRAMUSCULAR | Status: AC
  Filled 2016-04-07: qty 2

## 2016-04-07 MED ORDER — PROPOFOL 10 MG/ML IV BOLUS
INTRAVENOUS | Status: DC | PRN
Start: 2016-04-07 — End: 2016-04-07
  Administered 2016-04-07: 180 mg via INTRAVENOUS

## 2016-04-07 MED ORDER — ONDANSETRON HCL 4 MG/2ML IJ SOLN: 4.0000 mg | Freq: Once | INTRAMUSCULAR | Status: DC | PRN

## 2016-04-07 MED ORDER — LIDOCAINE 2% (20 MG/ML) 5 ML SYRINGE
INTRAMUSCULAR | Status: AC
  Filled 2016-04-07: qty 5

## 2016-04-07 MED ORDER — CEFAZOLIN SODIUM-DEXTROSE 2-4 GM/100ML-% IV SOLN
2.0000 g | INTRAVENOUS | Status: AC
  Administered 2016-04-07: 2 g via INTRAVENOUS

## 2016-04-07 MED ORDER — ONDANSETRON HCL 4 MG/2ML IJ SOLN
INTRAMUSCULAR | Status: AC
  Filled 2016-04-07: qty 2

## 2016-04-07 MED ORDER — CEFAZOLIN IN D5W 1 GM/50ML IV SOLN
1.0000 g | Freq: Once | INTRAVENOUS | Status: AC
  Administered 2016-04-07: 1 g via INTRAVENOUS
  Filled 2016-04-07: qty 50

## 2016-04-07 MED ORDER — CEFAZOLIN SODIUM-DEXTROSE 2-4 GM/100ML-% IV SOLN
INTRAVENOUS | Status: AC
  Filled 2016-04-07: qty 100

## 2016-04-07 MED ORDER — STERILE WATER FOR IRRIGATION IR SOLN
Status: DC | PRN
  Administered 2016-04-07: 1000 mL

## 2016-04-07 MED ORDER — SODIUM CHLORIDE 0.9 % IR SOLN
Status: DC | PRN
Start: 2016-04-07 — End: 2016-04-07
  Administered 2016-04-07: 1000 mL

## 2016-04-07 MED ORDER — BUPIVACAINE HCL (PF) 0.25 % IJ SOLN
INTRAMUSCULAR | Status: DC | PRN
  Administered 2016-04-07: 7 mL

## 2016-04-07 MED ORDER — MIDAZOLAM HCL 2 MG/2ML IJ SOLN
INTRAMUSCULAR | Status: AC
  Filled 2016-04-07: qty 2

## 2016-04-07 MED ORDER — FENTANYL CITRATE (PF) 100 MCG/2ML IJ SOLN
INTRAMUSCULAR | Status: DC | PRN
  Administered 2016-04-07 (×2): 50 ug via INTRAVENOUS

## 2016-04-07 MED ORDER — BUPIVACAINE HCL (PF) 0.25 % IJ SOLN
INTRAMUSCULAR | Status: AC
  Filled 2016-04-07: qty 30

## 2016-04-07 MED ORDER — SUGAMMADEX SODIUM 200 MG/2ML IV SOLN
INTRAVENOUS | Status: DC | PRN
  Administered 2016-04-07: 200 mg via INTRAVENOUS

## 2016-04-07 MED ORDER — TETANUS-DIPHTH-ACELL PERTUSSIS 5-2.5-18.5 LF-MCG/0.5 IM SUSP
0.5000 mL | Freq: Once | INTRAMUSCULAR | Status: AC
  Administered 2016-04-07: 0.5 mL via INTRAMUSCULAR
  Filled 2016-04-07: qty 0.5

## 2016-04-07 MED ORDER — FENTANYL CITRATE (PF) 100 MCG/2ML IJ SOLN
50.0000 ug | Freq: Once | INTRAMUSCULAR | Status: AC
  Administered 2016-04-07: 50 ug via INTRAVENOUS

## 2016-04-07 MED ORDER — FENTANYL CITRATE (PF) 100 MCG/2ML IJ SOLN
INTRAMUSCULAR | Status: AC
  Filled 2016-04-07: qty 4

## 2016-04-07 MED ORDER — MIDAZOLAM HCL 5 MG/5ML IJ SOLN
INTRAMUSCULAR | Status: DC | PRN
  Administered 2016-04-07: 2 mg via INTRAVENOUS

## 2016-04-07 MED ORDER — ROCURONIUM BROMIDE 100 MG/10ML IV SOLN
INTRAVENOUS | Status: DC | PRN
  Administered 2016-04-07: 50 mg via INTRAVENOUS

## 2016-04-07 MED ORDER — HYDROCODONE-IBUPROFEN 5-200 MG PO TABS: 1.0000 | ORAL_TABLET | Freq: Three times a day (TID) | ORAL | 0 refills | Status: DC | PRN

## 2016-04-07 MED ORDER — PHENYLEPHRINE HCL 10 MG/ML IJ SOLN
INTRAMUSCULAR | Status: DC | PRN
  Administered 2016-04-07: 80 ug via INTRAVENOUS
  Administered 2016-04-07: 40 ug via INTRAVENOUS
  Administered 2016-04-07: 80 ug via INTRAVENOUS

## 2016-04-07 MED ORDER — SUGAMMADEX SODIUM 200 MG/2ML IV SOLN
INTRAVENOUS | Status: AC
  Filled 2016-04-07: qty 2

## 2016-04-07 MED ORDER — ONDANSETRON HCL 4 MG/2ML IJ SOLN
INTRAMUSCULAR | Status: DC | PRN
  Administered 2016-04-07: 4 mg via INTRAVENOUS

## 2016-04-07 MED ORDER — FENTANYL CITRATE (PF) 100 MCG/2ML IJ SOLN: 25.0000 ug | INTRAMUSCULAR | Status: DC | PRN

## 2016-04-07 MED ORDER — ROCURONIUM BROMIDE 50 MG/5ML IV SOSY
PREFILLED_SYRINGE | INTRAVENOUS | Status: AC
  Filled 2016-04-07: qty 5

## 2016-04-07 SURGICAL SUPPLY — 59 items
BALL CTTN LRG ABS STRL LF (GAUZE/BANDAGES/DRESSINGS) ×1
BANDAGE ACE 4X5 VEL STRL LF (GAUZE/BANDAGES/DRESSINGS) ×3 IMPLANT
BANDAGE ELASTIC 3 VELCRO ST LF (GAUZE/BANDAGES/DRESSINGS) ×1 IMPLANT
BNDG CMPR 9X4 STRL LF SNTH (GAUZE/BANDAGES/DRESSINGS)
BNDG COHESIVE 1X5 TAN STRL LF (GAUZE/BANDAGES/DRESSINGS) ×2 IMPLANT
BNDG CONFORM 2 STRL LF (GAUZE/BANDAGES/DRESSINGS) IMPLANT
BNDG ESMARK 4X9 LF (GAUZE/BANDAGES/DRESSINGS) IMPLANT
BNDG GAUZE ELAST 4 BULKY (GAUZE/BANDAGES/DRESSINGS) ×4 IMPLANT
CORDS BIPOLAR (ELECTRODE) ×2 IMPLANT
COTTON STERILE ROLL (GAUZE/BANDAGES/DRESSINGS) ×2 IMPLANT
COTTONBALL LRG STERILE PKG (GAUZE/BANDAGES/DRESSINGS) ×2 IMPLANT
COVER SURGICAL LIGHT HANDLE (MISCELLANEOUS) ×6 IMPLANT
CUFF TOURNIQUET SINGLE 18IN (TOURNIQUET CUFF) ×3 IMPLANT
DECANTER SPIKE VIAL GLASS SM (MISCELLANEOUS) ×3 IMPLANT
DRAIN PENROSE 1/4X12 LTX STRL (WOUND CARE) IMPLANT
DRAPE SURG 17X23 STRL (DRAPES) ×3 IMPLANT
DRSG PAD ABDOMINAL 8X10 ST (GAUZE/BANDAGES/DRESSINGS) ×6 IMPLANT
DURAPREP 26ML APPLICATOR (WOUND CARE) IMPLANT
ELECT REM PT RETURN 9FT ADLT (ELECTROSURGICAL)
ELECTRODE REM PT RTRN 9FT ADLT (ELECTROSURGICAL) IMPLANT
GAUZE PACKING IODOFORM 1/4X5 (PACKING) IMPLANT
GAUZE SPONGE 4X4 12PLY STRL (GAUZE/BANDAGES/DRESSINGS) ×4 IMPLANT
GAUZE XEROFORM 1X8 LF (GAUZE/BANDAGES/DRESSINGS) ×3 IMPLANT
GLOVE SURG SYN 8.0 (GLOVE) ×3 IMPLANT
GLOVE SURG SYN 8.0 PF PI (GLOVE) ×1 IMPLANT
GOWN STRL REUS W/ TWL LRG LVL3 (GOWN DISPOSABLE) ×1 IMPLANT
GOWN STRL REUS W/ TWL XL LVL3 (GOWN DISPOSABLE) ×1 IMPLANT
GOWN STRL REUS W/TWL LRG LVL3 (GOWN DISPOSABLE) ×3
GOWN STRL REUS W/TWL XL LVL3 (GOWN DISPOSABLE) ×3
HANDPIECE INTERPULSE COAX TIP (DISPOSABLE)
KIT BASIN OR (CUSTOM PROCEDURE TRAY) ×3 IMPLANT
KIT ROOM TURNOVER OR (KITS) ×3 IMPLANT
MANIFOLD NEPTUNE II (INSTRUMENTS) ×3 IMPLANT
NDL HYPO 25GX1X1/2 BEV (NEEDLE) IMPLANT
NDL HYPO 25X1 1.5 SAFETY (NEEDLE) ×1 IMPLANT
NEEDLE HYPO 25GX1X1/2 BEV (NEEDLE) IMPLANT
NEEDLE HYPO 25X1 1.5 SAFETY (NEEDLE) ×3 IMPLANT
NS IRRIG 1000ML POUR BTL (IV SOLUTION) ×3 IMPLANT
PACK ORTHO EXTREMITY (CUSTOM PROCEDURE TRAY) ×3 IMPLANT
PAD ARMBOARD 7.5X6 YLW CONV (MISCELLANEOUS) ×6 IMPLANT
PAD CAST 4YDX4 CTTN HI CHSV (CAST SUPPLIES) ×2 IMPLANT
PADDING CAST COTTON 4X4 STRL (CAST SUPPLIES) ×6
SET HNDPC FAN SPRY TIP SCT (DISPOSABLE) IMPLANT
SPONGE LAP 18X18 X RAY DECT (DISPOSABLE) ×3 IMPLANT
SUCTION FRAZIER HANDLE 10FR (MISCELLANEOUS) ×2
SUCTION TUBE FRAZIER 10FR DISP (MISCELLANEOUS) ×1 IMPLANT
SUT VIC AB 4-0 P-3 18X BRD (SUTURE) IMPLANT
SUT VIC AB 4-0 P3 18 (SUTURE) ×3
SUT VICRYL RAPIDE 4/0 PS 2 (SUTURE) IMPLANT
SYR 20CC LL (SYRINGE) IMPLANT
SYR CONTROL 10ML LL (SYRINGE) ×3 IMPLANT
TOWEL OR 17X24 6PK STRL BLUE (TOWEL DISPOSABLE) ×3 IMPLANT
TOWEL OR 17X26 10 PK STRL BLUE (TOWEL DISPOSABLE) ×3 IMPLANT
TUBE ANAEROBIC SPECIMEN COL (MISCELLANEOUS) IMPLANT
TUBE CONNECTING 12'X1/4 (SUCTIONS) ×1
TUBE CONNECTING 12X1/4 (SUCTIONS) ×2 IMPLANT
UNDERPAD 30X30 (UNDERPADS AND DIAPERS) ×3 IMPLANT
WATER STERILE IRR 1000ML POUR (IV SOLUTION) ×3 IMPLANT
YANKAUER SUCT BULB TIP NO VENT (SUCTIONS) ×3 IMPLANT

## 2016-04-07 NOTE — ED Provider Notes (Signed)
[facility] DEPT Provider Note   CSN: [id number] Arrival date & time: 04/07/16  4098     History   Chief Complaint Chief Complaint  Patient presents with  . Hand Injury    R distal digit amputation    HPI KYRELL RUACHO is a 61 y.o. male.  The history is provided by the patient and medical records.   61 y.o. M with hx of HTN, here with right index finger laceration.  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  States he tried to pull his finger out and realized part of the distal tip was missing.  States he wrapped in gauze and came here. Bleeding is controlled on arrival. Patient denies any numbness or weakness but has significant pain. He is right-hand dominant. Date of last tetanus unknown.  Last oral intake at 0330 this morning.  Past Medical History:  Diagnosis Date  . Hypertension     There are no active problems to display for this patient.   Past Surgical History:  Procedure Laterality Date  . DENTAL SURGERY         Home Medications    Prior to Admission medications   Medication Sig Start Date End Date Taking? Authorizing Provider  ciprofloxacin (CIPRO) 500 MG tablet Take 1 tablet (500 mg total) by mouth 2 (two) times daily. 04/14/14   Renne Crigler, PA-C  ibuprofen (ADVIL,MOTRIN) 800 MG tablet Take 1 tablet (800 mg total) by mouth 3 (three) times daily. 04/14/14   Renne Crigler, PA-C  metroNIDAZOLE (FLAGYL) 500 MG tablet Take 1 tablet (500 mg total) by mouth 3 (three) times daily. 03/28/12   Glynn Octave, MD    Family History No family history on file.  Social History Social History  Substance Use Topics  . Smoking status: Current Some Day Smoker    Packs/day: 1.00    Types: Cigarettes  . Smokeless tobacco: Never Used  . Alcohol use No     Allergies   Other; Sudafed [pseudoephedrine hcl]; and Percocet  [oxycodone-acetaminophen]   Review of Systems Review of Systems  Musculoskeletal: Positive for arthralgias.  Skin: Positive for wound.  All other systems reviewed and are negative.    Physical Exam Updated Vital Signs BP 163/87 (BP Location: Left Arm)   Pulse 105   Temp 97.7 F (36.5 C) (Oral)   Resp 25   SpO2 100%   Physical Exam  Constitutional: He is oriented to person, place, and time. He appears well-developed and well-nourished.  HENT:  Head: Normocephalic and atraumatic.  Mouth/Throat: Oropharynx is clear and moist.  Eyes: Conjunctivae and EOM are normal. Pupils are equal, round, and reactive to light.  Neck: Normal range of motion.  Cardiovascular: Normal rate, regular rhythm and normal heart sounds.   Pulmonary/Chest: Effort normal and breath sounds normal. No respiratory distress. He has no wheezes.  Musculoskeletal: Normal range of motion.  Right index finger with extensive avulsion laceration of the distal pad of finger; nail remains intact without apparent damage; distal tuft of bone is visible and palpable within wound and appears intact; flexion/extension maintained; normal sensation throughout; hand is warm  Neurological: He is alert and oriented to person, place, and time.  Skin: Skin is warm and dry.  Psychiatric: He has a normal mood and affect.  Nursing note and vitals reviewed.       ED Treatments /  Results  Labs (all labs ordered are listed, but only abnormal results are displayed) Labs Reviewed  COMPREHENSIVE METABOLIC PANEL - Abnormal; Notable for the following:       Result Value   Glucose, Bld 144 (*)    BUN 23 (*)    Total Protein 6.0 (*)    ALT 15 (*)    All other components within normal limits  CBC    EKG  EKG Interpretation None       Radiology Dg Finger Index Right  Result Date: 04/07/2016 CLINICAL DATA:  Injury to the right second finger. EXAM: RIGHT INDEX FINGER 2+V COMPARISON:  None. FINDINGS: Evaluation is limited  due to overlying gauze material. There appears to be soft tissue avulsion of the distal aspect of the right second finger. Mild degenerative change in the distal interphalangeal joint. No evidence of acute fracture or dislocation. No radiopaque soft tissue foreign bodies. IMPRESSION: Soft tissue avulsion to the distal aspect of the right second finger. No acute bony abnormalities. Electronically Signed   By: Burman NievesWilliam  Stevens M.D.   On: 04/07/2016 06:08    Procedures Procedures (including critical care time)  Medications Ordered in ED Medications  fentaNYL (SUBLIMAZE) 100 MCG/2ML injection (not administered)  ceFAZolin (ANCEF) IVPB 1 g/50 mL premix (1 g Intravenous New Bag/Given 04/07/16 0641)  fentaNYL (SUBLIMAZE) injection 50 mcg (50 mcg Intravenous Given 04/07/16 0552)  Tdap (BOOSTRIX) injection 0.5 mL (0.5 mLs Intramuscular Given 04/07/16 0641)  morphine 4 MG/ML injection 4 mg (4 mg Intravenous Given 04/07/16 0640)     Initial Impression / Assessment and Plan / ED Course  I have reviewed the triage vital signs and the nursing notes.  Pertinent labs & imaging results that were available during my care of the patient were reviewed by me and considered in my medical decision making (see chart for details).  61 year old male here with right index finger injury. Finger was caught between cow and metal railing, tried to pull finger out and avulsed away most of distal pad.  Wound is hemostatic on arrival.  Distal tuft is visible and palpable through wound but appears intact.  Nail without apparent damage, remains intact.  Hand is warm, well perfused.  Discussed with hand surgery, Dr. Mina MarbleWeingold who has reviewed films and x-rays--- will see in the ED and likely take to OR for repair given exposed bone.  Patient given 1g ancef, tetanus updated.  Pain meds given.  Patient to remain NPO.  Final Clinical Impressions(s) / ED Diagnoses   Final diagnoses:  Laceration of right index finger without foreign  body without damage to nail, initial encounter    New Prescriptions Current Discharge Medication List       Garlon HatchetLisa M Catherene Kaleta, PA-C 04/07/16 0900    Gilda Creasehristopher J Pollina, MD 04/09/16 [phone]

## 2016-04-07 NOTE — Op Note (Signed)
See [id number]

## 2016-04-07 NOTE — Consult Note (Signed)
Reason for Consult: Right index finger distal tip soft tissue amputation Referring Physician: Betsey Holiday M.D.  Riley Anderson is an 61 y.o. male.  HPI: Status post trauma to right index finger while working on his forearm this morning presents with a distal soft tissue tip amputation to dominant index finger along the ulnar border. Patient's chief complaint is pain and deformity to the right index finger.  Past Medical History:  Diagnosis Date  . Hypertension     Past Surgical History:  Procedure Laterality Date  . DENTAL SURGERY      No family history on file.  Social History:  reports that he has been smoking Cigarettes.  He has been smoking about 1.00 pack per day. He has never used smokeless tobacco. He reports that he does not drink alcohol or use drugs.  Allergies:  Allergies  Allergen Reactions  . Other Anaphylaxis    Iron medication that starts with an "A"  . Sudafed [Pseudoephedrine Hcl]     Unknown  . Percocet [Oxycodone-Acetaminophen] Rash    Medications: Scheduled:  Results for orders placed or performed during the hospital encounter of 04/07/16 (from the past 48 hour(s))  CBC     Status: None   Collection Time: 04/07/16  6:00 AM  Result Value Ref Range   WBC 6.8 4.0 - 10.5 K/uL   RBC 4.48 4.22 - 5.81 MIL/uL   Hemoglobin 13.4 13.0 - 17.0 g/dL   HCT 41.1 39.0 - 52.0 %   MCV 91.7 78.0 - 100.0 fL   MCH 29.9 26.0 - 34.0 pg   MCHC 32.6 30.0 - 36.0 g/dL   RDW 13.6 11.5 - 15.5 %   Platelets 317 150 - 400 K/uL  Comprehensive metabolic panel     Status: Abnormal   Collection Time: 04/07/16  6:00 AM  Result Value Ref Range   Sodium 140 135 - 145 mmol/L   Potassium 4.3 3.5 - 5.1 mmol/L    Comment: SLIGHT HEMOLYSIS   Chloride 103 101 - 111 mmol/L   CO2 23 22 - 32 mmol/L   Glucose, Bld 144 (H) 65 - 99 mg/dL   BUN 23 (H) 6 - 20 mg/dL   Creatinine, Ser 1.01 0.61 - 1.24 mg/dL   Calcium 9.7 8.9 - 10.3 mg/dL   Total Protein 6.0 (L) 6.5 - 8.1 g/dL   Albumin 3.9 3.5 - 5.0  g/dL   AST 29 15 - 41 U/L   ALT 15 (L) 17 - 63 U/L   Alkaline Phosphatase 71 38 - 126 U/L   Total Bilirubin 0.7 0.3 - 1.2 mg/dL   GFR calc non Af Amer >60 >60 mL/min   GFR calc Af Amer >60 >60 mL/min    Comment: (NOTE) The eGFR has been calculated using the CKD EPI equation. This calculation has not been validated in all clinical situations. eGFR's persistently <60 mL/min signify possible Chronic Kidney Disease.    Anion gap 14 5 - 15    Dg Finger Index Right  Result Date: 04/07/2016 CLINICAL DATA:  Injury to the right second finger. EXAM: RIGHT INDEX FINGER 2+V COMPARISON:  None. FINDINGS: Evaluation is limited due to overlying gauze material. There appears to be soft tissue avulsion of the distal aspect of the right second finger. Mild degenerative change in the distal interphalangeal joint. No evidence of acute fracture or dislocation. No radiopaque soft tissue foreign bodies. IMPRESSION: Soft tissue avulsion to the distal aspect of the right second finger. No acute bony abnormalities. Electronically Signed  By: Lucienne Capers M.D.   On: 04/07/2016 06:08    Review of Systems  All other systems reviewed and are negative.  Blood pressure 157/94, pulse 69, temperature 97.7 F (36.5 C), temperature source Oral, resp. rate 20, height _0  (1.676 m), weight 65.8 kg (145 lb), SpO2 100 %. Physical Exam  Constitutional: He is oriented to person, place, and time. He appears well-developed and well-nourished.  HENT:  Head: Normocephalic and atraumatic.  Neck: Normal range of motion.  Cardiovascular: Normal rate.   Respiratory: Effort normal.  Musculoskeletal:       Right hand: He exhibits tenderness, disruption of two-point discrimination, deformity and laceration.  Right index finger distal tip amputation along the ulnar side with exposed distal phalanx.  Neurological: He is alert and oriented to person, place, and time.  Skin: Skin is warm.  Psychiatric: He has a normal mood and  affect. His behavior is normal. Judgment and thought content normal.    Assessment/Plan: As above. I have had a thorough in fact discussion with the patient regarding current treatment options. These include skeletal shortening, thenar flap, cross finger flap, and full-thickness skin grafting to the soft tissue defect. Patient understands with a full-thickness skin graft will have some decreased sensation in that area. Patient desires the quickest healing  due to the necessity of returning to work as soon as possible. Therefore after informed consent we will proceed with debridement of the distal phalanx and then full-thickness skin grafting from the hand. We will do this as an outpatient today.  [name] 04/07/2016, 8:51 AM

## 2016-04-07 NOTE — Transfer of Care (Signed)
Immediate Anesthesia Transfer of Care Note  Patient: Riley Anderson  Procedure(s) Performed: Procedure(s): IRRIGATION AND DEBRIDEMENT RIGHT INDEX FINGER WITH REPAIRS AND RECONSTRUCTION AS NECESSARY (Right)  Patient Location: PACU  Anesthesia Type:General  Level of Consciousness: awake, alert  and oriented  Airway & Oxygen Therapy: Patient Spontanous Breathing and Patient connected to nasal cannula oxygen  Post-op Assessment: Report given to RN and Post -op Vital signs reviewed and stable  Post vital signs: Reviewed and stable  Last Vitals:  Vitals:   04/07/16 0524 04/07/16 0831  BP: 163/87 157/94  Pulse: 105 69  Resp: 25 20  Temp: 36.5 C     Last Pain:  Vitals:   04/07/16 0716  TempSrc:   PainSc: 2          Complications: No apparent anesthesia complications

## 2016-04-07 NOTE — ED Notes (Signed)
ED Provider at bedside. 

## 2016-04-07 NOTE — ED Triage Notes (Addendum)
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 Pt in significant distress in triage. Needs TDAP updated

## 2016-04-07 NOTE — Anesthesia Postprocedure Evaluation (Signed)
Anesthesia Post Note  Patient: Riley Anderson  Procedure(s) Performed: Procedure(s) (LRB): IRRIGATION AND DEBRIDEMENT RIGHT INDEX FINGER WITH REPAIRS AND RECONSTRUCTION AS NECESSARY (Right)  Patient location during evaluation: PACU Anesthesia Type: General Level of consciousness: awake, awake and alert and oriented Pain management: pain level controlled Vital Signs Assessment: post-procedure vital signs reviewed and stable Respiratory status: spontaneous breathing, nonlabored ventilation and respiratory function stable Cardiovascular status: blood pressure returned to baseline Anesthetic complications: no       Last Vitals:  Vitals:   04/07/16 1100 04/07/16 1115  BP: 129/87 122/81  Pulse: 80 68  Resp: (!) 24 (!) 21  Temp:  36.2 C    Last Pain:  Vitals:   04/07/16 1045  TempSrc:   PainSc: 0-No pain                 Calise Dunckel COKER

## 2016-04-07 NOTE — Anesthesia Procedure Notes (Signed)
Procedure Name: Intubation Date/Time: 04/07/2016 9:50 AM Performed by: Candis Shine Pre-anesthesia Checklist: Patient identified, Emergency Drugs available, Suction available and Patient being monitored Patient Re-evaluated:Patient Re-evaluated prior to inductionOxygen Delivery Method: Circle System Utilized Preoxygenation: Pre-oxygenation with 100% oxygen Intubation Type: IV induction Ventilation: Mask ventilation without difficulty and Oral airway inserted - appropriate to patient size Laryngoscope Size: Mac and 3 Grade View: Grade I Tube type: Oral Tube size: 7.5 mm Number of attempts: 1 Airway Equipment and Method: Stylet and Oral airway Placement Confirmation: ETT inserted through vocal cords under direct vision,  positive ETCO2 and breath sounds checked- equal and bilateral Secured at: 22 cm Tube secured with: Tape Dental Injury: Teeth and Oropharynx as per pre-operative assessment

## 2016-04-07 NOTE — Anesthesia Preprocedure Evaluation (Signed)
Anesthesia Evaluation  Patient identified by MRN, date of birth, ID band Patient awake    Reviewed: Allergy & Precautions, NPO status , Patient's Chart, lab work & pertinent test results  Airway Mallampati: II  TM Distance: >3 FB     Dental  (+) Edentulous Upper   Pulmonary Current Smoker,    + rhonchi        Cardiovascular hypertension,  Rhythm:Regular Rate:Normal     Neuro/Psych    GI/Hepatic   Endo/Other    Renal/GU      Musculoskeletal   Abdominal   Peds  Hematology   Anesthesia Other Findings   Reproductive/Obstetrics                             Anesthesia Physical Anesthesia Plan  ASA: III and emergent  Anesthesia Plan: General   Post-op Pain Management:    Induction: Intravenous  Airway Management Planned: Oral ETT  Additional Equipment:   Intra-op Plan:   Post-operative Plan: Extubation in OR  Informed Consent: I have reviewed the patients History and Physical, chart, labs and discussed the procedure including the risks, benefits and alternatives for the proposed anesthesia with the patient or authorized representative who has indicated his/her understanding and acceptance.     Plan Discussed with: CRNA and Anesthesiologist  Anesthesia Plan Comments:         Anesthesia Quick Evaluation

## 2016-04-07 NOTE — Op Note (Signed)
NAMWarren Lacy:  Drouillard, Generoso                  ACCOUNT NO.:  [id number]  MEDICAL RECORD NO.:  [id number]  LOCATION:  A03C                         FACILITY:  [facility]  PHYSICIAN:  Artist PaisMatthew A. Aurthur Wingerter, M.D.DATE OF BIRTH:  02/16/1956  DATE OF PROCEDURE:  04/07/2016 DATE OF DISCHARGE:                              [facility]  PREOPERATIVE DIAGNOSIS:  Traumatic injury right index and right long finger.  POSTOPERATIVE DIAGNOSIS:  Traumatic injury right index and right long finger.  PROCEDURE:  Right index finger skeletal shortening with full-thickness skin grafting to the tip of the index finger on the right from the hypothenar eminence and exploration of traumatic deep wound, palmar aspect, left long finger and primary closure.  SURGEON:  Artist PaisMatthew A. Mina MarbleWeingold, MD.  ASSISTANT:  None.  ANESTHESIA:  General.  COMPLICATION:  No complications.  DRAINS:  No drains.  DESCRIPTION OF PROCEDURE:  The patient was taken to the operating suite. After induction of adequate general anesthetic, the right upper extremity was prepped and draped in the sterile fashion.  An Esmarch was used to exsanguinate the limb.  Tourniquet was then inflated to 250 mmHg.  At this point in time, we approached the volar aspect of the index and long finger on the right hand.  The right index finger had loss of soft tissue along the ulnar border from the DIP flexion crease to the nail plate.  There was exposed distal phalangeal bone.  There was no exposed tendon.  We carefully used a rongeur to slightly shorten the distal phalanx until we could cover some of the soft tissue.  Once this was done, we irrigated thoroughly.  We then obtained a full-thickness skin graft in hypothenar eminence to fit the defect and sutured it in place using 4-0 Vicryl suture to cover the exposed distal phalangeal bone.  After this was done, we primarily closed the hypothenar donor site with 4-0 nylon.   There was a deep wound along the middle phalanx of the left long finger that was explored.  The ulnar and radial neurovascular bundles were identified and they were intact.  The flexor tendon was intact as well. The wound was thoroughly irrigated and loosely closed with 4-0 nylon. We then dressed with Xeroform, 4x4s, Coban wrap to the fingers, and a compression wrap to the donor site.  The patient tolerated these procedures well, and went to recovery room in stable fashion.     Artist PaisMatthew A. Mina MarbleWeingold, M.D.     MAW/MEDQ  D:  04/07/2016  T:  04/07/2016  Job:  [id number]

## 2016-04-08 ENCOUNTER — Encounter (HOSPITAL_COMMUNITY): Payer: Self-pay | Admitting: Orthopedic Surgery

## 2017-09-06 IMAGING — DX DG FINGER INDEX 2+V*R*
3 series · 3 of 3 positions shown · non-contrast
Comparison: None.

CLINICAL DATA: Injury to the right second finger.

EXAM:
RIGHT INDEX FINGER 2+V

[finger ap]
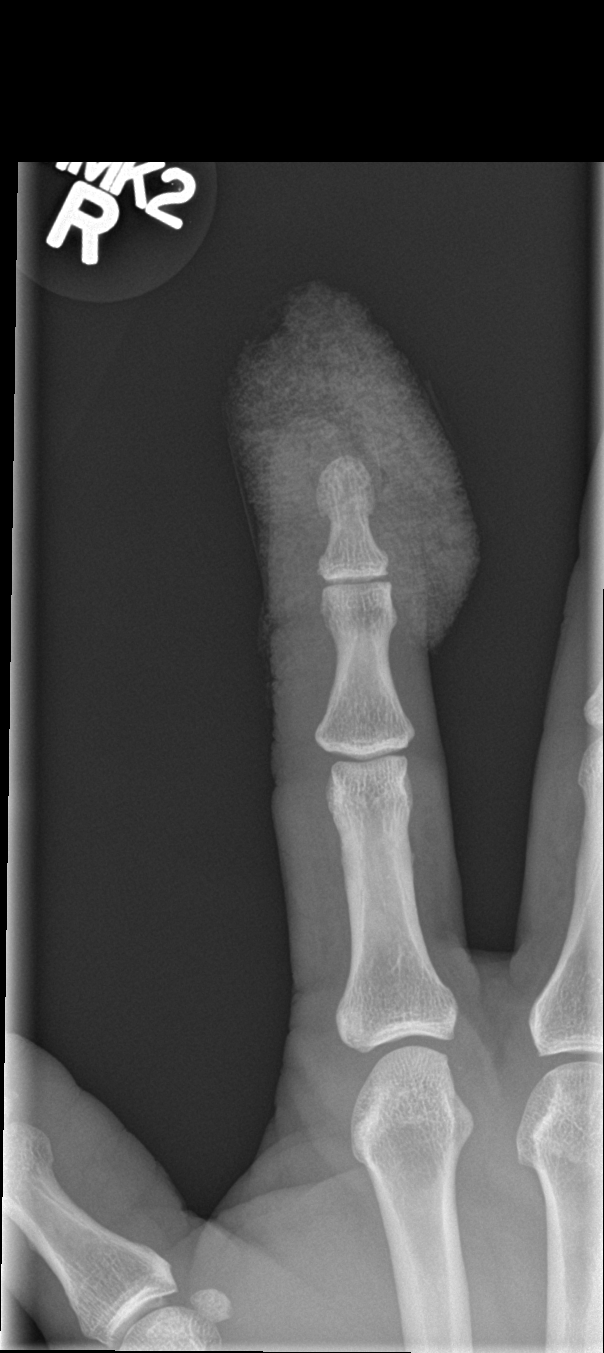

[finger obl]
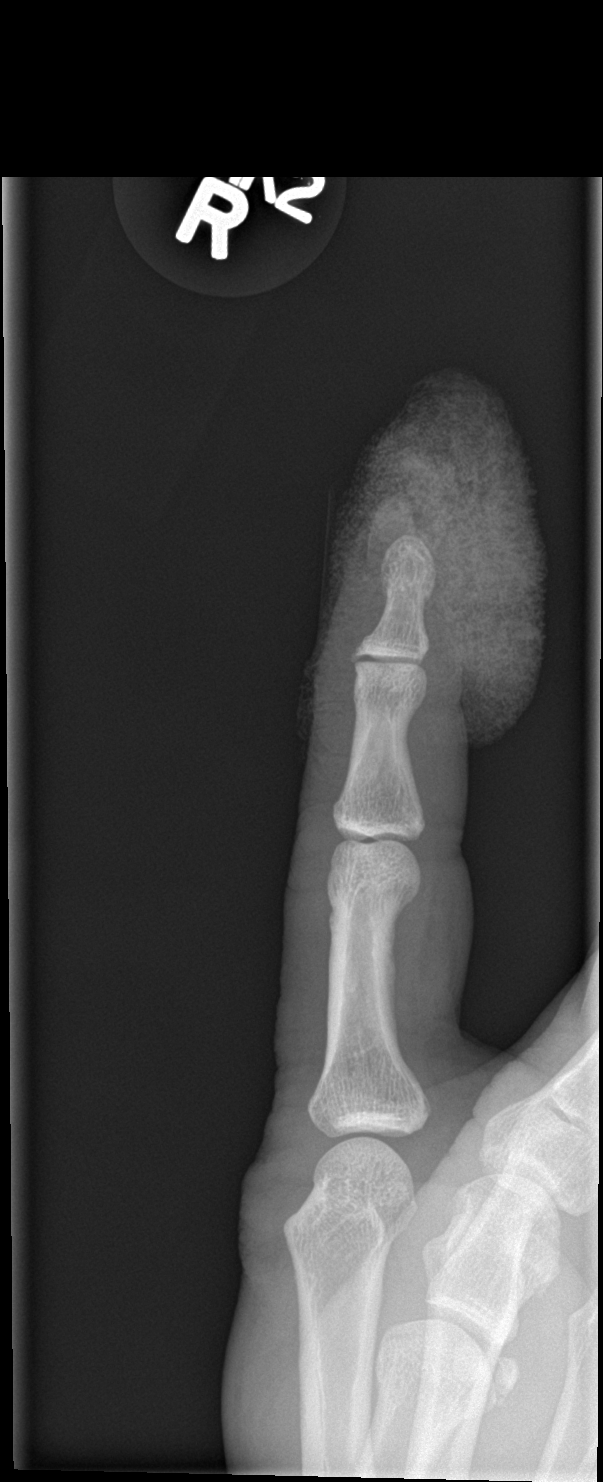

[finger lat]
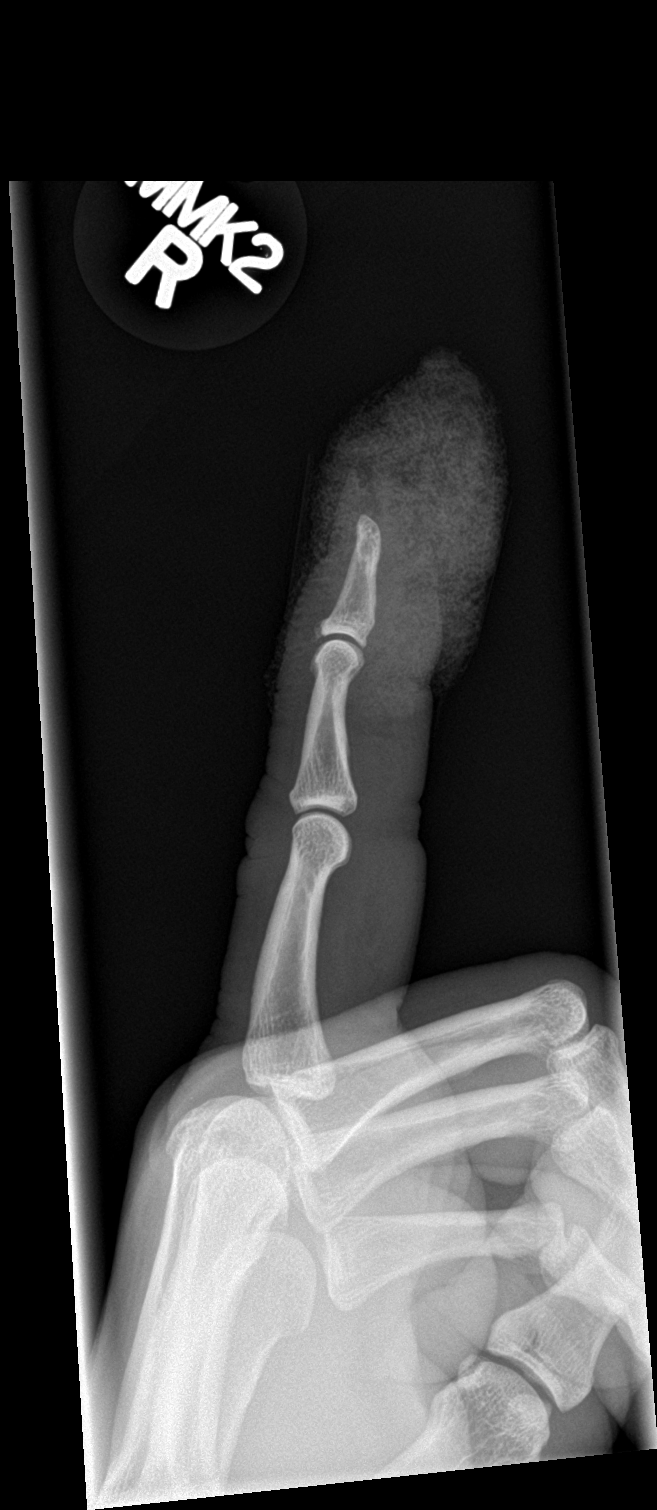

[3 of 3 positions shown; findings below may reference images not displayed]

FINDINGS: Evaluation is limited due to overlying gauze material. There appears
to be soft tissue avulsion of the distal aspect of the right second
finger. Mild degenerative change in the distal interphalangeal
joint. No evidence of acute fracture or dislocation. No radiopaque
soft tissue foreign bodies.
IMPRESSION: Soft tissue avulsion to the distal aspect of the right second
finger. No acute bony abnormalities.

## 2019-08-08 ENCOUNTER — Other Ambulatory Visit: Payer: Self-pay

## 2019-08-08 ENCOUNTER — Emergency Department (HOSPITAL_COMMUNITY)
Admission: EM | Admit: 2019-08-08 | Discharge: 2019-08-08 | Disposition: A | Discharge status: Home / Self Care | Payer: Self-pay | Attending: Emergency Medicine | Admitting: Emergency Medicine

## 2019-08-08 ENCOUNTER — Encounter (HOSPITAL_COMMUNITY): Payer: Self-pay | Admitting: Emergency Medicine

## 2019-08-08 DIAGNOSIS — I10 Essential (primary) hypertension: Secondary | ICD-10-CM | POA: Insufficient documentation

## 2019-08-08 DIAGNOSIS — F17210 Nicotine dependence, cigarettes, uncomplicated: Secondary | ICD-10-CM | POA: Insufficient documentation

## 2019-08-08 DIAGNOSIS — M1712 Unilateral primary osteoarthritis, left knee: Secondary | ICD-10-CM | POA: Insufficient documentation

## 2019-08-08 NOTE — ED Triage Notes (Signed)
C/o L lower back pain x 6 months that radiates down L leg.  Reports L knee pain x 1 1 /2 years.

## 2019-08-08 NOTE — Discharge Instructions (Signed)
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  Please take your medications as prescribed for pain.  Please read the attached information and call [facility] for follow-up appointment with them.

## 2019-08-08 NOTE — ED Provider Notes (Signed)
[facility] EMERGENCY DEPARTMENT Provider Note   CSN: [id number] Arrival date & time: 08/08/19  1835     History Chief Complaint  Patient presents with  . Back Pain  . Leg Pain    Riley Anderson is a 64 y.o. male.  HPI  Patient is a 64 year old male presented today with complaints of low back pain for 6 months and left knee pain for almost 2 years.  He denies any new symptoms today and states that he came because he feels that his Tylenol and Aleve are not helping him.  He states that he occasionally takes Norco.  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  He states it is achy, worse with movements.  He states that he has noticed some swelling in his knee.  He denies any fevers, chills, warmth or tenderness or difficulty moving his leg.  Denies any history of knee surgery.  No long mobilization, he denies any paresthesias, numbness or weakness.      Past Medical History:  Diagnosis Date  . Hypertension     There are no problems to display for this patient.   Past Surgical History:  Procedure Laterality Date  . DENTAL SURGERY    . I & D EXTREMITY Right 04/07/2016   Procedure: IRRIGATION AND DEBRIDEMENT RIGHT INDEX FINGER WITH REPAIRS AND RECONSTRUCTION AS NECESSARY;  Surgeon: Dairl Ponder, MD;  Location: [facility] OR;  Service: Orthopedics;  Laterality: Right;       No family history on file.  Social History   Tobacco Use  . Smoking status: Current Some Day Smoker    Packs/day: 1.00    Types: Cigarettes  . Smokeless tobacco: Never Used  Substance Use Topics  . Alcohol use: No  . Drug use: No    Home Medications Prior to Admission medications   Medication Sig Start Date End Date Taking? Authorizing Provider  ciprofloxacin (CIPRO) 500 MG tablet Take 1 tablet (500 mg total) by mouth 2 (two) times daily. Patient not taking: Reported on 04/07/2016 04/14/14   Renne Crigler, PA-C  hydrocodone-ibuprofen (VICOPROFEN) 5-200 MG tablet Take 1 tablet by mouth every 8 (eight) hours as needed for pain. 04/07/16   Dairl Ponder, MD  ibuprofen (ADVIL,MOTRIN) 800 MG tablet Take 1 tablet (800 mg total) by mouth 3 (three) times daily. Patient not taking: Reported on 04/07/2016 04/14/14   Renne Crigler, PA-C  metroNIDAZOLE (FLAGYL) 500 MG tablet Take 1 tablet (500 mg total) by mouth 3 (three) times daily. Patient not taking: Reported on 04/07/2016 03/28/12   Glynn Octave, MD    Allergies    Other, Sudafed [pseudoephedrine hcl], and Percocet [oxycodone-acetaminophen]  Review of Systems   Review of Systems  Constitutional: Negative for chills and fever.  HENT: Negative for congestion.   Respiratory: Negative for shortness of breath.   Cardiovascular: Negative for chest pain.  Gastrointestinal: Negative for abdominal pain.  Musculoskeletal: Negative for neck pain.       Left knee pain, swelling    Physical Exam Updated Vital Signs BP (!) 159/106 (BP Location: Right Arm)   Pulse 98   Temp 98.8 F (37.1 C) (Oral)   Resp 18   SpO2 98%   Physical Exam Vitals and nursing note reviewed.  Constitutional:      General: He is not in acute distress.    Appearance: Normal appearance. He is not ill-appearing.  HENT:  Head: Normocephalic and atraumatic.     Mouth/Throat:     Mouth: Mucous membranes are moist.  Eyes:     General: No scleral icterus.       Right eye: No discharge.        Left eye: No discharge.     Conjunctiva/sclera: Conjunctivae normal.  Cardiovascular:     Comments: Intact 3+ symmetric DP PT pulse Pulmonary:     Effort: Pulmonary effort is normal.     Breath sounds: No stridor.  Musculoskeletal:        General: Swelling present. No tenderness. Normal range of motion.     Comments: Left knee with no significant tenderness to palpation. There is positive balloon test and ballottement.  There is no warmth or tenderness.  Full range of  motion of knee strength is 5/5 with flexion extension.  No midline tenderness of the back.  Range of motion of spine.  Skin:    General: Skin is warm and dry.     Capillary Refill: Capillary refill takes less than 2 seconds.  Neurological:     Mental Status: He is alert and oriented to person, place, and time. Mental status is at baseline.     Comments: Sensation intact in bilateral lower extremities.  Psychiatric:        Mood and Affect: Mood normal.        Behavior: Behavior normal.     ED Results / Procedures / Treatments   Labs (all labs ordered are listed, but only abnormal results are displayed) Labs Reviewed - No data to display  EKG None  Radiology No results found.  Procedures Procedures (including critical care time)  Medications Ordered in ED Medications - No data to display  ED Course  I have reviewed the triage vital signs and the nursing notes.  Pertinent labs & imaging results that were available during my care of the patient were reviewed by me and considered in my medical decision making (see chart for details).    MDM Rules/Calculators/A&P                          Patient is well-appearing nonseptic pleasant 64 year old male with a history of osteoarthritis in his left knee presented today with left knee pain and swelling.  Is been ongoing for approximately 2 years at this point.  He is also complaining of some back pain which is been going on for over 6 months.  Patient has full range of motion of left knee low suspicion for septic arthritis.  High suspicion for this being osteoarthritis related knee effusion.   As patient is also discussing back pain I considered the following differential.  Broad differential for back pain considered includes malignancy, disc herniation, spinal epidural abscess, spinal fracture, cauda equina, pyelonephritis, kidney stone, AAA, AD, pancreatitis, PE and PTX.   History without symptoms of urinary or stool retention  or incontinence, neurologic changes such as sensation change or weakness lower extremities, coagulopathy or blood thinner use, is not elderly or with history of osteoporosis, denies any history of cancer, fever, IV drug use, weight changes (unexplained), or prolonged steroid use.   Physical exam most consistent with muscular strain. Doubt cauda equina or disc herniation d/t lack of saddle anesthesia/bowel or bladder incontinence or urinary retention, normal gait and reassuring physical examination without neurologic deficits.   History is not supportive of kidney stone, AAA, AD, pancreatitis, PE or PTX. Patient has no CVA tenderness or  urinary sx to suggest pyelonephritis or kidney stone.   Will manage patient conservatively at this time. NSAIDs, back exercises/stretches, heat therapy and follow up with PCP if symptoms do not resolve in 3-4 weeks. Patient offered muscle relaxer for comfort at night. Counseled on need to return to ED for fever, worsening or concerning symptoms. Patient agreeable to plan and states understanding of follow up plans and return precautions.   Vitals WNL at time of discharge and patient is no acute distress.  I discussed with patient my plan to discharge and have him follow-up with orthopedics as he has exhausted conservative therapy at this time.  We will follow up with orthopedics and discuss further care.  He is agreeable to plan.  Given return precautions for concerning symptoms that would indicate septic arthritis.  Final Clinical Impression(s) / ED Diagnoses Final diagnoses:  Osteoarthritis of left knee, unspecified osteoarthritis type    Rx / DC Orders ED Discharge Orders    None       Tedd Sias, [place] 08/08/19 2222    Blanchie Dessert, MD 08/08/19 [phone]

## 2022-03-23 ENCOUNTER — Ambulatory Visit
Admission: EM | Admit: 2022-03-23 | Discharge: 2022-03-23 | Disposition: A | Discharge status: Home / Self Care | Payer: Medicare Other | Attending: Emergency Medicine | Admitting: Emergency Medicine

## 2022-03-23 DIAGNOSIS — I10 Essential (primary) hypertension: Secondary | ICD-10-CM | POA: Insufficient documentation

## 2022-03-23 DIAGNOSIS — Z1152 Encounter for screening for COVID-19: Secondary | ICD-10-CM | POA: Diagnosis not present

## 2022-03-23 DIAGNOSIS — R059 Cough, unspecified: Secondary | ICD-10-CM | POA: Diagnosis present

## 2022-03-23 DIAGNOSIS — Z20822 Contact with and (suspected) exposure to covid-19: Secondary | ICD-10-CM | POA: Diagnosis not present

## 2022-03-23 DIAGNOSIS — J42 Unspecified chronic bronchitis: Secondary | ICD-10-CM | POA: Diagnosis present

## 2022-03-23 HISTORY — DX: Unspecified chronic bronchitis: J42

## 2022-03-23 MED ORDER — PREDNISONE 10 MG PO TABS: 40.0000 mg | ORAL_TABLET | Freq: Every day | ORAL | 0 refills | Status: AC

## 2022-03-23 MED ORDER — ALBUTEROL SULFATE HFA 108 (90 BASE) MCG/ACT IN AERS: 1.0000 | INHALATION_SPRAY | Freq: Four times a day (QID) | RESPIRATORY_TRACT | 0 refills | Status: DC | PRN

## 2022-03-23 NOTE — ED Provider Notes (Signed)
[facility]    CSN: [id number] Arrival date & time: 03/23/22  1304      History   Chief Complaint Chief Complaint  Patient presents with   Headache    HPI ORLA JOLLIFF is a 67 y.o. male.  Patient presents with headache, malaise, dry mouth, cough x 1 day.  He reports exposure to COVID.  He smokes x 50 years.  He denies fever, chills, chest pain, shortness of breath, vomiting, diarrhea.  He has been using his sister's albuterol inhaler.  His medical history includes hypertension and chronic bronchitis.  He does not have a PCP currently and is not on medications for his blood pressure.    The history is provided by the patient and medical records.    Past Medical History:  Diagnosis Date   Chronic bronchitis ([place])    Hypertension     There are no problems to display for this patient.   Past Surgical History:  Procedure Laterality Date   DENTAL SURGERY     I & D EXTREMITY Right 04/07/2016   Procedure: IRRIGATION AND DEBRIDEMENT RIGHT INDEX FINGER WITH REPAIRS AND RECONSTRUCTION AS NECESSARY;  Surgeon: Charlotte Crumb, MD;  Location: [place];  Service: Orthopedics;  Laterality: Right;       Home Medications    Prior to Admission medications   Medication Sig Start Date End Date Taking? Authorizing Provider  albuterol (VENTOLIN HFA) 108 (90 Base) MCG/ACT inhaler Inhale 1-2 puffs into the lungs every 6 (six) hours as needed. 03/23/22  Yes Sharion Balloon, NP  predniSONE (DELTASONE) 10 MG tablet Take 4 tablets (40 mg total) by mouth daily for 5 days. 03/23/22 03/28/22 Yes Sharion Balloon, NP  ciprofloxacin (CIPRO) 500 MG tablet Take 1 tablet (500 mg total) by mouth 2 (two) times daily. Patient not taking: Reported on 04/07/2016 04/14/14   Carlisle Cater, PA-C  hydrocodone-ibuprofen (VICOPROFEN) 5-200 MG tablet Take 1 tablet by mouth every 8 (eight) hours as needed for pain. 04/07/16   Charlotte Crumb, MD  ibuprofen (ADVIL,MOTRIN) 800 MG tablet Take 1 tablet (800 mg total) by  mouth 3 (three) times daily. Patient not taking: Reported on 04/07/2016 04/14/14   Carlisle Cater, PA-C  metroNIDAZOLE (FLAGYL) 500 MG tablet Take 1 tablet (500 mg total) by mouth 3 (three) times daily. Patient not taking: Reported on 04/07/2016 03/28/12   Ezequiel Essex, MD    Family History No family history on file.  Social History Social History   Tobacco Use   Smoking status: Every Day    Packs/day: 1.00    Types: Cigarettes   Smokeless tobacco: Never  Substance Use Topics   Alcohol use: No   Drug use: No     Allergies   Other, Sudafed [pseudoephedrine hcl], and Percocet [oxycodone-acetaminophen]   Review of Systems Review of Systems  Constitutional:  Positive for fatigue. Negative for chills and fever.  HENT:  Negative for ear pain and sore throat.   Respiratory:  Positive for cough. Negative for shortness of breath.   Cardiovascular:  Negative for chest pain and palpitations.  Gastrointestinal:  Negative for diarrhea and vomiting.  Skin:  Negative for rash.  Neurological:  Positive for headaches.  All other systems reviewed and are negative.    Physical Exam Triage Vital Signs ED Triage Vitals  Enc Vitals Group     BP 03/23/22 1405 (!) 174/100     Pulse Rate 03/23/22 1405 97     Resp 03/23/22 1405 19  Temp 03/23/22 1405 98.6 F (37 C)     Temp src --      SpO2 03/23/22 1405 94 %     Weight --      Height --      Head Circumference --      Peak Flow --      Pain Score 03/23/22 1403 9     Pain Loc --      Pain Edu? --      Excl. in GC? --    No data found.  Updated Vital Signs BP (!) 156/99   Pulse 97   Temp 98.6 F (37 C)   Resp 19   SpO2 94%   Visual Acuity Right Eye Distance:   Left Eye Distance:   Bilateral Distance:    Right Eye Near:   Left Eye Near:    Bilateral Near:     Physical Exam Vitals and nursing note reviewed.  Constitutional:      General: He is not in acute distress.    Appearance: Normal appearance. He is  well-developed. He is not ill-appearing.  HENT:     Right Ear: Tympanic membrane normal.     Left Ear: Tympanic membrane normal.     Nose: Nose normal.     Mouth/Throat:     Mouth: Mucous membranes are moist.     Pharynx: Oropharynx is clear.  Cardiovascular:     Rate and Rhythm: Normal rate and regular rhythm.     Heart sounds: Normal heart sounds.  Pulmonary:     Effort: Pulmonary effort is normal. No respiratory distress.     Breath sounds: Wheezing and rhonchi present.     Comments: Faint scattered wheezes and rhonchi.  Musculoskeletal:     Cervical back: Neck supple.  Skin:    General: Skin is warm and dry.  Neurological:     Mental Status: He is alert.  Psychiatric:        Mood and Affect: Mood normal.        Behavior: Behavior normal.      UC Treatments / Results  Labs (all labs ordered are listed, but only abnormal results are displayed) Labs Reviewed  SARS CORONAVIRUS 2 (TAT 6-24 HRS)    EKG   Radiology No results found.  Procedures Procedures (including critical care time)  Medications Ordered in UC Medications - No data to display  Initial Impression / Assessment and Plan / UC Course  I have reviewed the triage vital signs and the nursing notes.  Pertinent labs & imaging results that were available during my care of the patient were reviewed by me and considered in my medical decision making (see chart for details).    Elevated blood pressure with HTN, cough, chronic bronchitis.  50+ years of smoking.  Our nurse scheduled appointment for patient to establish with a PCP.  COVID pending.  If COVID positive, recommend treatment with molnupiravir.  Treating today with albuterol inhaler and prednisone.  Discussed symptomatic treatment including Tylenol, rest, hydration.  Education provided on chronic bronchitis.  Instructed patient to follow up with his new PCP as scheduled.  Discussed with patient that his blood pressure is elevated today and needs to be  rechecked next week.  Education provided on managing hypertension.  He agrees to plan of care.   Final Clinical Impressions(s) / UC Diagnoses   Final diagnoses:  Elevated blood pressure reading in office with diagnosis of hypertension  Cough, unspecified type  Chronic bronchitis, unspecified chronic  bronchitis type [facility])     Discharge Instructions      Take the prednisone and use the albuterol inhaler as directed.  Follow up with your new primary care provider as scheduled.   Your blood pressure is elevated today at 174/100; repeat 156/99.  Please have this rechecked by your primary care provider next week.          ED Prescriptions     Medication Sig Dispense Auth. Provider   albuterol (VENTOLIN HFA) 108 (90 Base) MCG/ACT inhaler Inhale 1-2 puffs into the lungs every 6 (six) hours as needed. 18 g Sharion Balloon, NP   predniSONE (DELTASONE) 10 MG tablet Take 4 tablets (40 mg total) by mouth daily for 5 days. 20 tablet Sharion Balloon, NP      PDMP not reviewed this encounter.   Sharion Balloon, NP 03/23/22 [phone]

## 2022-03-23 NOTE — ED Notes (Signed)
Primary care appointment established for patient. Appointment made at [facility] with Romilda Garret 05/13/22 at [place].

## 2022-03-23 NOTE — ED Triage Notes (Addendum)
Patient to Urgent Care with complaints of "not feeling good", headache, and dry mouth. Exposed to covid positive coworker.  Symptoms started yesterday. Taking theraflu.

## 2022-03-23 NOTE — Discharge Instructions (Addendum)
Take the prednisone and use the albuterol inhaler as directed.  Follow up with your new primary care provider as scheduled.   Your blood pressure is elevated today at 174/100; repeat 156/99.  Please have this rechecked by your primary care provider next week.

## 2022-03-25 LAB — SARS CORONAVIRUS 2 (TAT 6-24 HRS): SARS Coronavirus 2: NEGATIVE

## 2022-05-13 ENCOUNTER — Ambulatory Visit (INDEPENDENT_AMBULATORY_CARE_PROVIDER_SITE_OTHER): Payer: Medicare Other | Admitting: Nurse Practitioner

## 2022-05-13 ENCOUNTER — Encounter: Payer: Self-pay | Admitting: Nurse Practitioner

## 2022-05-13 VITALS — BP 186/102 | HR 58 | Temp 99.2°F | Resp 16 | Ht 65.75 in | Wt 174.0 lb

## 2022-05-13 DIAGNOSIS — J439 Emphysema, unspecified: Secondary | ICD-10-CM | POA: Insufficient documentation

## 2022-05-13 DIAGNOSIS — E663 Overweight: Secondary | ICD-10-CM | POA: Diagnosis not present

## 2022-05-13 DIAGNOSIS — Z125 Encounter for screening for malignant neoplasm of prostate: Secondary | ICD-10-CM

## 2022-05-13 DIAGNOSIS — I10 Essential (primary) hypertension: Secondary | ICD-10-CM | POA: Diagnosis not present

## 2022-05-13 DIAGNOSIS — Z720 Tobacco use: Secondary | ICD-10-CM

## 2022-05-13 DIAGNOSIS — Z122 Encounter for screening for malignant neoplasm of respiratory organs: Secondary | ICD-10-CM

## 2022-05-13 DIAGNOSIS — Z1211 Encounter for screening for malignant neoplasm of colon: Secondary | ICD-10-CM | POA: Diagnosis not present

## 2022-05-13 DIAGNOSIS — J42 Unspecified chronic bronchitis: Secondary | ICD-10-CM

## 2022-05-13 DIAGNOSIS — Z7689 Persons encountering health services in other specified circumstances: Secondary | ICD-10-CM | POA: Insufficient documentation

## 2022-05-13 DIAGNOSIS — F17200 Nicotine dependence, unspecified, uncomplicated: Secondary | ICD-10-CM | POA: Insufficient documentation

## 2022-05-13 LAB — CBC
HCT: 43.1 % (ref 39.0–52.0)
Hemoglobin: 14.5 g/dL (ref 13.0–17.0)
MCHC: 33.7 g/dL (ref 30.0–36.0)
MCV: 91.6 fl (ref 78.0–100.0)
Platelets: 310 10*3/uL (ref 150.0–400.0)
RBC: 4.7 Mil/uL (ref 4.22–5.81)
RDW: 15.1 % (ref 11.5–15.5)
WBC: 7 10*3/uL (ref 4.0–10.5)

## 2022-05-13 LAB — COMPREHENSIVE METABOLIC PANEL
ALT: 13 U/L (ref 0–53)
AST: 13 U/L (ref 0–37)
Albumin: 4.4 g/dL (ref 3.5–5.2)
Alkaline Phosphatase: 84 U/L (ref 39–117)
BUN: 22 mg/dL (ref 6–23)
CO2: 28 mEq/L (ref 19–32)
Calcium: 9.8 mg/dL (ref 8.4–10.5)
Chloride: 102 mEq/L (ref 96–112)
Creatinine, Ser: 1.26 mg/dL (ref 0.40–1.50)
GFR: 59.91 mL/min — ABNORMAL LOW (ref >60.00)
Glucose, Bld: 106 mg/dL — ABNORMAL HIGH (ref 70–99)
Potassium: 4.2 mEq/L (ref 3.5–5.1)
Sodium: 139 mEq/L (ref 135–145)
Total Bilirubin: 0.3 mg/dL (ref 0.2–1.2)
Total Protein: 6.9 g/dL (ref 6.0–8.3)

## 2022-05-13 LAB — PSA, MEDICARE: PSA: 6.98 ng/ml — ABNORMAL HIGH (ref 0.10–4.00)

## 2022-05-13 LAB — HEMOGLOBIN A1C: Hgb A1c MFr Bld: 6.4 % (ref 4.6–6.5)

## 2022-05-13 LAB — LIPID PANEL
Cholesterol: 192 mg/dL (ref 0–200)
HDL: 41.6 mg/dL (ref >39.00)
LDL Cholesterol: 117 mg/dL — ABNORMAL HIGH (ref 0–99)
NonHDL: 150.3
Total CHOL/HDL Ratio: 5
Triglycerides: 166 mg/dL — ABNORMAL HIGH (ref 0.0–149.0)
VLDL: 33.2 mg/dL (ref 0.0–40.0)

## 2022-05-13 LAB — TSH: TSH: 2.2 u[IU]/mL (ref 0.35–5.50)

## 2022-05-13 MED ORDER — OLMESARTAN MEDOXOMIL 20 MG PO TABS: 20.0000 mg | ORAL_TABLET | Freq: Every day | ORAL | 0 refills | Status: DC

## 2022-05-13 NOTE — Assessment & Plan Note (Signed)
Has never quit in the past.  Is not set against quitting.

## 2022-05-13 NOTE — Assessment & Plan Note (Signed)
Able to review emergency department visits only.  No other information in EMR

## 2022-05-13 NOTE — Assessment & Plan Note (Signed)
Patient still smoking.  Currently albuterol inhaler up to 3-4 times a week but only during cold weather.  Patient states he is used it once in the past week.  Will continue albuterol inhaler at this juncture pending LDCT

## 2022-05-13 NOTE — Assessment & Plan Note (Signed)
Pending labs today inclusive of lipid

## 2022-05-13 NOTE — Assessment & Plan Note (Signed)
Patient has several elevated blood pressure readings when he is visited outpatient acute care facilities.  Blood pressure elevated on recheck today will start patient on olmesartan 20 mg daily.  Follow-up 1 month

## 2022-05-13 NOTE — Patient Instructions (Signed)
Nice to see you today I will be in touch with the labs once I have them Follow up with me in 1 month, sooner if you need me 

## 2022-05-13 NOTE — Progress Notes (Signed)
New Patient Office Visit  Subjective    Patient ID: Riley Anderson, male    DOB: Jun 22, 1955  Age: 67 y.o. MRN: [id number]  CC:  Chief Complaint  Patient presents with   [place] Only    HPI Riley Anderson presents to establish care   Elevated blood pressure: States that he has never been on blood pressure medication in the past. States that he will get 3 headaches and take BC powders.  Chronic bronchitis: states that he is using the albuterol 3-4 times a week. Cold weather is harder on him   Smoking: states that he has not tired to stop   OA: states that he is followed by Dr Harlow Mares.  Per last office note candidate for partial knee arthroplasty.  Once blood pressure under control and be cleared by primary care.  Tdap: 2018 Flu: refused Shingles: refused  Covid: refused  Colonoscopy: cologuard  LDCT: needs done PSA: due  Outpatient Encounter Medications as of 05/13/2022  Medication Sig   albuterol (VENTOLIN HFA) 108 (90 Base) MCG/ACT inhaler Inhale 1-2 puffs into the lungs every 6 (six) hours as needed.   olmesartan (BENICAR) 20 MG tablet Take 1 tablet (20 mg total) by mouth daily.   ciprofloxacin (CIPRO) 500 MG tablet Take 1 tablet (500 mg total) by mouth 2 (two) times daily. (Patient not taking: Reported on 04/07/2016)   hydrocodone-ibuprofen (VICOPROFEN) 5-200 MG tablet Take 1 tablet by mouth every 8 (eight) hours as needed for pain. (Patient not taking: Reported on 05/13/2022)   ibuprofen (ADVIL,MOTRIN) 800 MG tablet Take 1 tablet (800 mg total) by mouth 3 (three) times daily. (Patient not taking: Reported on 05/13/2022)   metroNIDAZOLE (FLAGYL) 500 MG tablet Take 1 tablet (500 mg total) by mouth 3 (three) times daily. (Patient not taking: Reported on 04/07/2016)   No facility-administered encounter medications on file as of 05/13/2022.    Past Medical History:  Diagnosis Date   Chronic bronchitis ([place])    Hypertension     Past Surgical History:  Procedure  Laterality Date   DENTAL SURGERY     I & D EXTREMITY Right 04/07/2016   Procedure: IRRIGATION AND DEBRIDEMENT RIGHT INDEX FINGER WITH REPAIRS AND RECONSTRUCTION AS NECESSARY;  Surgeon: Charlotte Crumb, MD;  Location: [place];  Service: Orthopedics;  Laterality: Right;    Family History  Problem Relation Age of Onset   Arthritis Mother    Hypertension Father    COPD Father    Cancer Father    Melanoma Father    Diabetes Sister    Cancer Sister    Thyroid cancer Brother     Social History   Socioeconomic History   Marital status: Married    Spouse name: Kennyth Lose   Number of children: 2   Years of education: Not on file   Highest education level: Not on file  Occupational History   Not on file  Tobacco Use   Smoking status: Every Day    Packs/day: 1.00    Years: 50.00    Additional pack years: 0.00    Total pack years: 50.00    Types: Cigarettes   Smokeless tobacco: Never  Vaping Use   Vaping Use: Never used  Substance and Sexual Activity   Alcohol use: No   Drug use: No   Sexual activity: Not on file  Other Topics Concern   Not on file  Social History Narrative   39   36  Retired, works on the side Armed forces operational officer   Social Determinants of Radio broadcast assistant Strain: Not on [employer] Insecurity: Not on file  Transportation Needs: Not on file  Physical Activity: Not on file  Stress: Not on file  Social Connections: Not on file  Intimate Partner Violence: Not on file    Review of Systems  Constitutional:  Negative for chills and fever.  Respiratory:  Negative for shortness of breath.   Cardiovascular:  Negative for chest pain and leg swelling.  Gastrointestinal:  Negative for abdominal pain, constipation, diarrhea, nausea and vomiting.       BM daily   Genitourinary:  Negative for dysuria and hematuria.  Neurological:  Positive for headaches.  Psychiatric/Behavioral:  Negative for hallucinations and suicidal ideas.          Objective    BP (!) 186/102   Pulse (!) 58   Temp 99.2 F (37.3 C)   Resp 16   Ht 5' 5.75" (1.67 m)   Wt 174 lb (78.9 kg)   SpO2 95%   BMI 28.30 kg/m   Physical Exam Vitals and nursing note reviewed.  Constitutional:      Appearance: Normal appearance.  HENT:     Right Ear: Tympanic membrane, ear canal and external ear normal.     Left Ear: Tympanic membrane, ear canal and external ear normal.     Mouth/Throat:     Mouth: Mucous membranes are moist.     Pharynx: Oropharynx is clear.  Eyes:     Extraocular Movements: Extraocular movements intact.     Pupils: Pupils are equal, round, and reactive to light.  Cardiovascular:     Rate and Rhythm: Normal rate and regular rhythm.     Pulses: Normal pulses.     Heart sounds: Normal heart sounds.  Pulmonary:     Effort: Pulmonary effort is normal.     Breath sounds: Normal breath sounds.  Musculoskeletal:     Right lower leg: No edema.     Left lower leg: No edema.  Lymphadenopathy:     Cervical: No cervical adenopathy.  Skin:    General: Skin is warm.  Neurological:     General: No focal deficit present.     Mental Status: He is alert.     Deep Tendon Reflexes:     Reflex Scores:      Bicep reflexes are 2+ on the right side and 2+ on the left side.      Patellar reflexes are 2+ on the right side and 2+ on the left side.    Comments: Bilateral upper extremity strength 5/5  RLE 5/5 LLE 4/5 due to pain   Psychiatric:        Mood and Affect: Mood normal.        Behavior: Behavior normal.        Thought Content: Thought content normal.        Judgment: Judgment normal.         Assessment & Plan:   Problem List Items Addressed This Visit       Cardiovascular and Mediastinum   Primary hypertension    Patient has several elevated blood pressure readings when he is visited outpatient acute care facilities.  Blood pressure elevated on recheck today will start patient on olmesartan 20 mg daily.  Follow-up 1  month      Relevant Medications   olmesartan (BENICAR) 20 MG tablet   Other Relevant Orders  CBC   Comprehensive metabolic panel   Hemoglobin A1c   TSH   Lipid panel     Respiratory   Chronic bronchitis ([employer])    Patient still smoking.  Currently albuterol inhaler up to 3-4 times a week but only during cold weather.  Patient states he is used it once in the past week.  Will continue albuterol inhaler at this juncture pending LDCT        Other   Encounter to establish care - Primary    Able to review emergency department visits only.  No other information in EMR      Tobacco abuse    Has never quit in the past.  Is not set against quitting.      Overweight    Pending labs today inclusive of lipid      Relevant Orders   Hemoglobin A1c   TSH   Lipid panel   Other Visit Diagnoses     Screening for colon cancer       Relevant Orders   Cologuard   Screening for prostate cancer       Relevant Orders   PSA, Medicare   Screening for lung cancer       Relevant Orders   Ambulatory Referral Lung Cancer Screening [facility]       Return in about 4 weeks (around 06/10/2022) for BP recheck.   Romilda Garret, NP

## 2022-05-14 ENCOUNTER — Telehealth: Payer: Self-pay | Admitting: Nurse Practitioner

## 2022-05-14 NOTE — Telephone Encounter (Signed)
He can have potassium containing fruits and vegetables, he just does not need to eat them 3 times a day and in large amounts. Limiting added salt to food is advised.  The medication HAS THE POTENTIAL to elevated potassium that is why he has to come back in a month for lab follow up

## 2022-05-14 NOTE — Telephone Encounter (Signed)
-----   Message from Barkley Bruns, [place] sent at 05/14/2022 12:14 PM EDT ----- Called patient reviewed all information and repeated back to me. Will call if any questions.   Pt wanted to know if there is another blood pressure medication he said that he can not eat anything with potassium in it or sodium or could cause a heart attack.

## 2022-05-14 NOTE — Telephone Encounter (Signed)
Left message to return call to our office.  

## 2022-05-14 NOTE — Telephone Encounter (Signed)
Patient returned call,and I relayed Matts message to him,and he okayed everything.

## 2022-05-20 ENCOUNTER — Telehealth: Payer: Self-pay | Admitting: Nurse Practitioner

## 2022-05-20 ENCOUNTER — Other Ambulatory Visit: Payer: Self-pay

## 2022-05-20 DIAGNOSIS — I10 Essential (primary) hypertension: Secondary | ICD-10-CM

## 2022-05-20 MED ORDER — OLMESARTAN MEDOXOMIL 20 MG PO TABS: 20.0000 mg | ORAL_TABLET | Freq: Every day | ORAL | 0 refills | Status: DC

## 2022-05-20 NOTE — Telephone Encounter (Signed)
Rx sent to provider
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
## 2022-05-20 NOTE — Telephone Encounter (Signed)
Pt called in to get refill on his BP medication pt did not know the name of RX . Please advise # [phone]

## 2022-05-21 ENCOUNTER — Other Ambulatory Visit: Payer: Self-pay

## 2022-05-21 DIAGNOSIS — I10 Essential (primary) hypertension: Secondary | ICD-10-CM

## 2022-05-21 MED ORDER — OLMESARTAN MEDOXOMIL 20 MG PO TABS: 20.0000 mg | ORAL_TABLET | Freq: Every day | ORAL | 0 refills | Status: DC

## 2022-05-21 NOTE — Telephone Encounter (Signed)
Patient called in again regarding getting this medication sent in for him.He still does not know the name of the medication.

## 2022-05-21 NOTE — Telephone Encounter (Signed)
Resent to correct pharmacy called [facility] and canceled the other one.

## 2022-05-21 NOTE — Telephone Encounter (Signed)
Spoke to pt and sent RX in

## 2022-05-21 NOTE — Telephone Encounter (Signed)
Rx sent to wrong pharmacy resent to correct pharmacy.

## 2022-05-21 NOTE — Telephone Encounter (Signed)
Patient stated that this medication should have been sent to  [id number] - [place], [place] - [address] Phone: [phone]  Fax: [phone]

## 2022-06-13 ENCOUNTER — Ambulatory Visit: Payer: Medicare Other | Admitting: Nurse Practitioner

## 2022-06-18 ENCOUNTER — Encounter: Payer: Self-pay | Admitting: Nurse Practitioner

## 2022-06-18 ENCOUNTER — Ambulatory Visit (INDEPENDENT_AMBULATORY_CARE_PROVIDER_SITE_OTHER): Payer: Medicare Other | Admitting: Nurse Practitioner

## 2022-06-18 VITALS — BP 142/82 | HR 60 | Temp 97.7°F | Resp 16 | Ht 65.75 in | Wt 171.0 lb

## 2022-06-18 DIAGNOSIS — I10 Essential (primary) hypertension: Secondary | ICD-10-CM

## 2022-06-18 DIAGNOSIS — F411 Generalized anxiety disorder: Secondary | ICD-10-CM

## 2022-06-18 DIAGNOSIS — R972 Elevated prostate specific antigen [PSA]: Secondary | ICD-10-CM | POA: Diagnosis not present

## 2022-06-18 LAB — PSA: PSA: 9.18 ng/mL — ABNORMAL HIGH (ref 0.10–4.00)

## 2022-06-18 LAB — BASIC METABOLIC PANEL
BUN: 23 mg/dL (ref 6–23)
CO2: 27 mEq/L (ref 19–32)
Calcium: 9.5 mg/dL (ref 8.4–10.5)
Chloride: 105 mEq/L (ref 96–112)
Creatinine, Ser: 1.25 mg/dL (ref 0.40–1.50)
GFR: 60.45 mL/min (ref >60.00)
Glucose, Bld: 99 mg/dL (ref 70–99)
Potassium: 4.3 mEq/L (ref 3.5–5.1)
Sodium: 141 mEq/L (ref 135–145)

## 2022-06-18 MED ORDER — OLMESARTAN MEDOXOMIL 40 MG PO TABS: 40.0000 mg | ORAL_TABLET | Freq: Every day | ORAL | 0 refills | Status: DC

## 2022-06-18 MED ORDER — BUSPIRONE HCL 5 MG PO TABS: 5.0000 mg | ORAL_TABLET | Freq: Two times a day (BID) | ORAL | 1 refills | Status: DC

## 2022-06-18 NOTE — Assessment & Plan Note (Signed)
Patient is maintained on olmesartan 20 mg.  Blood pressure elevated at beginning of office visit and upon recheck.  Patient's blood pressure elevated at home also.  Will titrate up to olmesartan 40 mg.  Precautions for lightheadedness dizziness given.  Patient will come up in 1 month for recheck.

## 2022-06-18 NOTE — Assessment & Plan Note (Signed)
Elevated PSA on recent blood work.  DRE done today left side felt larger on palpation the right repeat PSA today if elevated consider urology referral.  Patient made aware of plan pending labs
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
## 2022-06-18 NOTE — Patient Instructions (Signed)
Nice to see you today I have sent in a new blood pressure medication and some anxiety medication Follow up with me in 4 weeks for another recheck, sooner if you need me
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
## 2022-06-19 ENCOUNTER — Telehealth: Payer: Self-pay | Admitting: Nurse Practitioner

## 2022-06-19 DIAGNOSIS — R972 Elevated prostate specific antigen [PSA]: Secondary | ICD-10-CM

## 2022-06-19 NOTE — Telephone Encounter (Signed)
-----   Message from Vertis Kelch, [place] sent at 06/19/2022 12:22 PM EDT ----- Called patient reviewed all information and repeated back to me. Will call if any questions.  Pt prefers a urologist in [place].

## 2022-06-19 NOTE — Telephone Encounter (Signed)
Referral placed.

## 2022-06-21 ENCOUNTER — Ambulatory Visit: Payer: Medicare Other | Admitting: Nurse Practitioner

## 2022-07-05 ENCOUNTER — Telehealth: Payer: Self-pay

## 2022-07-05 ENCOUNTER — Ambulatory Visit (INDEPENDENT_AMBULATORY_CARE_PROVIDER_SITE_OTHER): Payer: Medicare Other | Admitting: Urology

## 2022-07-05 ENCOUNTER — Encounter: Payer: Self-pay | Admitting: Urology

## 2022-07-05 VITALS — BP 152/94 | HR 71 | Ht 65.75 in | Wt 171.0 lb

## 2022-07-05 DIAGNOSIS — R972 Elevated prostate specific antigen [PSA]: Secondary | ICD-10-CM | POA: Diagnosis not present

## 2022-07-05 LAB — URINALYSIS, COMPLETE
Bilirubin, UA: NEGATIVE
Glucose, UA: NEGATIVE
Ketones, UA: NEGATIVE
Nitrite, UA: NEGATIVE
Specific Gravity, UA: 1.015 (ref 1.005–1.030)
Urobilinogen, Ur: 0.2 mg/dL (ref 0.2–1.0)
pH, UA: 5.5 (ref 5.0–7.5)

## 2022-07-05 LAB — MICROSCOPIC EXAMINATION

## 2022-07-05 NOTE — Progress Notes (Signed)
I, Maysun L Gibbs,acting as a scribe for Riki Altes, MD.,have documented all relevant documentation on the behalf of Riki Altes, MD,as directed by  Riki Altes, MD while in the presence of Riki Altes, MD.  07/05/2022 1:12 PM   Riley Anderson March 19, 1955 [id number]  Referring provider: Eden Emms, NP [address] [place],  [place] [address]  Chief Complaint  Patient presents with   New Patient (Initial Visit)   Elevated PSA    HPI: Riley Anderson is a 67 y.o. male here for evaluation of an elevated PSA.   PSA drawn 05/13/2022 was elevated at 6.98; PSA repeated 06/18/2022 and was 9.18. Mild urinary frequency and urgency. No dysuria, gross hematuria, or recurrent UTI.  No history or family history of prostate cancer.  Prior history of stone disease several years ago seen in [place].  PSA trend  PSA  Latest Ref Rng 0.10 - 4.00 ng/mL  05/13/2022 6.98 (H)   06/18/2022 9.18 (H)      PMH: Past Medical History:  Diagnosis Date   Chronic bronchitis (HCC)    Hypertension     Surgical History: Past Surgical History:  Procedure Laterality Date   DENTAL SURGERY     I & D EXTREMITY Right 04/07/2016   Procedure: IRRIGATION AND DEBRIDEMENT RIGHT INDEX FINGER WITH REPAIRS AND RECONSTRUCTION AS NECESSARY;  Surgeon: Dairl Ponder, MD;  Location: [facility] OR;  Service: Orthopedics;  Laterality: Right;    Home Medications:  Allergies as of 07/05/2022       Reactions   Other Anaphylaxis   Iron medication that starts with an "A"   Sudafed [pseudoephedrine Hcl]    Unknown   Percocet [oxycodone-acetaminophen] Rash        Medication List        Accurate as of Jul 05, 2022  1:12 PM. If you have any questions, ask your nurse or doctor.          STOP taking these medications    busPIRone 5 MG tablet Commonly known as: BUSPAR Stopped by: Riki Altes, MD   ciprofloxacin 500 MG tablet Commonly known as: CIPRO Stopped by: Riki Altes, MD    hydrocodone-ibuprofen 5-200 MG tablet Commonly known as: VICOPROFEN Stopped by: Riki Altes, MD   ibuprofen 800 MG tablet Commonly known as: ADVIL Stopped by: Riki Altes, MD   metroNIDAZOLE 500 MG tablet Commonly known as: FLAGYL Stopped by: Riki Altes, MD       TAKE these medications    albuterol 108 (90 Base) MCG/ACT inhaler Commonly known as: VENTOLIN HFA Inhale 1-2 puffs into the lungs every 6 (six) hours as needed.   olmesartan 40 MG tablet Commonly known as: BENICAR Take 1 tablet (40 mg total) by mouth daily.        Allergies:  Allergies  Allergen Reactions   Other Anaphylaxis    Iron medication that starts with an "A"   Sudafed [Pseudoephedrine Hcl]     Unknown   Percocet [Oxycodone-Acetaminophen] Rash    Family History: Family History  Problem Relation Age of Onset   Arthritis Mother    Hypertension Father    COPD Father    Cancer Father    Melanoma Father    Diabetes Sister    Cancer Sister    Thyroid cancer Brother     Social History:  reports that he has been smoking cigarettes. He has a 50.00 pack-year smoking history. He has never  used smokeless tobacco. He reports that he does not drink alcohol and does not use drugs.   Physical Exam: BP (!) 152/94   Pulse 71   Ht 5' 5.75" (1.67 m)   Wt 171 lb (77.6 kg)   BMI 27.81 kg/m   Constitutional:  Alert and oriented, No acute distress. HEENT: [place] AT Respiratory: Normal respiratory effort, no increased work of breathing. GU: Prostate 60 grams, smooth without nodules. Psychiatric: Normal mood and affect.   Assessment & Plan:    1. Elevated PSA Although PSA is a prostate cancer screening test he was informed that cancer is not the most common cause of an elevated PSA. Other potential causes including BPH and inflammation were discussed. He was informed that the only way to adequately diagnose prostate cancer would be a transrectal ultrasound and biopsy of the prostate. The  procedure was discussed including potential risks of bleeding and infection/sepsis. He was also informed that a negative biopsy does not conclusively rule out the possibility that prostate cancer may be present and that continued monitoring is required. The use of newer adjunctive blood tests including PHI and 4kScore were discussed. The use of multiparametric prostate MRI to assess for lesions suspicious for high grade and aid in targeted bx was reviewed. Continued periodic surveillance was also discussed.  He has elected to schedule prostate MRI.  We'll notify with results and further recommendations at that time.  I have reviewed the above documentation for accuracy and completeness, and I agree with the above.   Riki Altes, MD  [facility] [address] [place], [place] [address] [phone]

## 2022-07-05 NOTE — Progress Notes (Signed)
Riley Anderson presents for an office/procedure visit. BP today is 07/05/2022. He is complaint with BP medication. Greater than 140/90. Provider  notified. Pt advised to contact PCP. Pt voiced understanding.

## 2022-07-05 NOTE — Patient Instructions (Signed)
Prostate MRI Prep:  1- No ejaculation 48 hours prior to exam  2- No caffeine or carbonated beverages on day of the exam  3- Eat light diet evening prior and day of exam  4- Avoid eating 4 hours prior to exam  5- Fleets enema needs to be done 4 hours prior to exam -See below. Can be purchased at the drug store.      

## 2022-07-05 NOTE — Telephone Encounter (Signed)
Noted. I recommend to be seen in office

## 2022-07-05 NOTE — Telephone Encounter (Signed)
Patient daughter called with patient. States they just left urology and his BP was elevated. He has been taking olmesartan 40mg  daily no missed doses. He has been checking at home daily and for the last week readings have been 160's. Patient denies any chest pain, SOB, headache or changes in vision. He denies any palpitation or other symptoms. Tried set up visit with Jae Dire today but patient declined. They asked to check schedule and call office back later to set up visit.

## 2022-07-08 ENCOUNTER — Telehealth: Payer: Self-pay

## 2022-07-08 NOTE — Telephone Encounter (Signed)
The microscopic exam of his urine at last visit showed no significant RBCs or WBCs; antibiotic therapy would not be indicated.  Looks like he is scheduled for his prostate MRI and will contact with results

## 2022-07-08 NOTE — Telephone Encounter (Signed)
Ashley-patient's daughter, and patient on the phone together called in regards to 2 things. 1) Patient wanted to let you know that his father and brother both had enlarged prostate and had to take daily medication. He found out that information after his last visit.  2) they saw the UA results and wondering if patient should get an antibiotic? Patient is still having urinary frequency, urgency of urination. No incontinence, no blood in the urine, no back or stomach pain, no fever or chills. No urine culture was done during last office visit. CB [phone]

## 2022-07-09 ENCOUNTER — Encounter: Payer: Self-pay | Admitting: *Deleted

## 2022-07-13 ENCOUNTER — Ambulatory Visit
Admission: RE | Admit: 2022-07-13 | Discharge: 2022-07-13 | Disposition: A | Discharge status: Home / Self Care | Payer: Medicare Other | Source: Ambulatory Visit | Attending: Urology | Admitting: Urology

## 2022-07-13 DIAGNOSIS — R972 Elevated prostate specific antigen [PSA]: Secondary | ICD-10-CM

## 2022-07-13 MED ORDER — GADOBUTROL 1 MMOL/ML IV SOLN
7.5000 mL | Freq: Once | INTRAVENOUS | Status: AC | PRN
  Administered 2022-07-13: 7.5 mL via INTRAVENOUS

## 2022-07-17 ENCOUNTER — Encounter: Payer: Self-pay | Admitting: Urology

## 2022-07-24 ENCOUNTER — Encounter: Payer: Self-pay | Admitting: *Deleted

## 2022-07-24 ENCOUNTER — Telehealth: Payer: Self-pay | Admitting: *Deleted

## 2022-07-24 ENCOUNTER — Other Ambulatory Visit: Payer: Self-pay

## 2022-07-24 ENCOUNTER — Encounter: Payer: Self-pay | Admitting: Physician Assistant

## 2022-07-24 ENCOUNTER — Ambulatory Visit (INDEPENDENT_AMBULATORY_CARE_PROVIDER_SITE_OTHER): Payer: Medicare Other | Admitting: Physician Assistant

## 2022-07-24 VITALS — BP 138/86 | HR 85 | Temp 98.6°F | Ht 67.5 in | Wt 172.0 lb

## 2022-07-24 DIAGNOSIS — Z1211 Encounter for screening for malignant neoplasm of colon: Secondary | ICD-10-CM

## 2022-07-24 DIAGNOSIS — I10 Essential (primary) hypertension: Secondary | ICD-10-CM | POA: Diagnosis not present

## 2022-07-24 DIAGNOSIS — M545 Low back pain, unspecified: Secondary | ICD-10-CM

## 2022-07-24 DIAGNOSIS — M47816 Spondylosis without myelopathy or radiculopathy, lumbar region: Secondary | ICD-10-CM | POA: Insufficient documentation

## 2022-07-24 DIAGNOSIS — F17200 Nicotine dependence, unspecified, uncomplicated: Secondary | ICD-10-CM

## 2022-07-24 DIAGNOSIS — M159 Polyosteoarthritis, unspecified: Secondary | ICD-10-CM

## 2022-07-24 DIAGNOSIS — M79605 Pain in left leg: Secondary | ICD-10-CM

## 2022-07-24 MED ORDER — OLMESARTAN MEDOXOMIL-HCTZ 40-12.5 MG PO TABS: 1.0000 | ORAL_TABLET | Freq: Every day | ORAL | 1 refills | Status: DC

## 2022-07-24 MED ORDER — GABAPENTIN 300 MG PO CAPS: 300.0000 mg | ORAL_CAPSULE | Freq: Three times a day (TID) | ORAL | 1 refills | Status: DC

## 2022-07-24 MED ORDER — NICOTINE 21 MG/24HR TD PT24: 21.0000 mg | MEDICATED_PATCH | Freq: Every day | TRANSDERMAL | 0 refills | Status: AC

## 2022-07-24 NOTE — Telephone Encounter (Signed)
:      Date of fusion bx: Time:09/04/2022 @3 :15pm   Blood Thinners- N/a    Fleet's Enema OTC 2 hours prior - Pt aware.   Follow up appt: Instructions sent via my chart or mailed. JQ
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
## 2022-07-24 NOTE — Patient Instructions (Addendum)
-  It was a pleasure to see you today! Please review your visit summary for helpful information -I would encourage you to follow your care via MyChart where you can access lab results, notes, messages, and more -If you feel that we did a nice job today, please complete your after-visit survey and leave [place] a Google review! Your CMA today was Mariann Barter and your provider was Alvester Morin, PA-C, DMSc -Please return for follow-up in about 4 wks  -Try tylenol arthritis 650 mg. You can take two of them every 8h -get a new BP cuff, preferably for the arm -Reviewed the "Seven S's" of hypertension including salt, smoking, stimulants (e.g. caffeine), stress, sleep, snoring (OSA), sedentary lifestyle.

## 2022-08-05 ENCOUNTER — Other Ambulatory Visit: Payer: Self-pay

## 2022-08-05 ENCOUNTER — Telehealth: Payer: Self-pay

## 2022-08-05 DIAGNOSIS — Z1211 Encounter for screening for malignant neoplasm of colon: Secondary | ICD-10-CM
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
## 2022-08-07 DIAGNOSIS — M1712 Unilateral primary osteoarthritis, left knee: Secondary | ICD-10-CM | POA: Diagnosis not present

## 2022-08-16 DIAGNOSIS — Z1211 Encounter for screening for malignant neoplasm of colon: Secondary | ICD-10-CM | POA: Diagnosis not present

## 2022-08-21 ENCOUNTER — Ambulatory Visit (INDEPENDENT_AMBULATORY_CARE_PROVIDER_SITE_OTHER): Payer: Medicare Other | Admitting: Physician Assistant

## 2022-08-21 ENCOUNTER — Telehealth: Payer: Self-pay | Admitting: Physician Assistant

## 2022-08-21 ENCOUNTER — Encounter: Payer: Self-pay | Admitting: Physician Assistant

## 2022-08-21 VITALS — BP 118/88 | HR 95 | Temp 98.0°F | Ht 67.5 in | Wt 173.0 lb

## 2022-08-21 DIAGNOSIS — I10 Essential (primary) hypertension: Secondary | ICD-10-CM | POA: Diagnosis not present

## 2022-08-21 DIAGNOSIS — Z122 Encounter for screening for malignant neoplasm of respiratory organs: Secondary | ICD-10-CM | POA: Diagnosis not present

## 2022-08-21 DIAGNOSIS — Z01818 Encounter for other preprocedural examination: Secondary | ICD-10-CM

## 2022-08-21 DIAGNOSIS — E782 Mixed hyperlipidemia: Secondary | ICD-10-CM

## 2022-08-21 DIAGNOSIS — N400 Enlarged prostate without lower urinary tract symptoms: Secondary | ICD-10-CM

## 2022-08-21 DIAGNOSIS — Z136 Encounter for screening for cardiovascular disorders: Secondary | ICD-10-CM | POA: Diagnosis not present

## 2022-08-21 DIAGNOSIS — F17200 Nicotine dependence, unspecified, uncomplicated: Secondary | ICD-10-CM | POA: Diagnosis not present

## 2022-08-21 DIAGNOSIS — Z87891 Personal history of nicotine dependence: Secondary | ICD-10-CM

## 2022-08-21 MED ORDER — OLMESARTAN MEDOXOMIL-HCTZ 40-25 MG PO TABS: 1.0000 | ORAL_TABLET | Freq: Every day | ORAL | 1 refills | Status: DC

## 2022-08-21 MED ORDER — ATORVASTATIN CALCIUM 10 MG PO TABS: 10.0000 mg | ORAL_TABLET | Freq: Every day | ORAL | 1 refills | Status: DC

## 2022-08-21 NOTE — Telephone Encounter (Signed)
Copied from CRM [id number]. Topic: General - Other >> Aug 21, 2022 12:55 PM [place] B wrote: Reason for CRM: pt's daughter called in wanted to let Dr Mordecai Maes know that pt is still smoking and not using the patches, in case he tells him otherwise at his appt today

## 2022-08-21 NOTE — Progress Notes (Addendum)
Date:  08/21/2022   Name:  Riley Anderson   DOB:  [birth year]   MRN:  [id number]   Chief Complaint: Hypertension and Pre-op Exam  HPI Gerod returns for preoperative exam anticipating left partial knee replacement 09/18/2022 with Dr. Marilynn Rail.    Also here following up on blood pressure, not monitoring at home.  Says the arm cuffs are too expensive and the wrist cuff is inaccurate.    Unable to quit smoking.  He tried the nicotine patches prescribed on last visit, but did not really feel like they gave him adequate nicotine, so he stopped them and returned to smoking.   In need of EKG today.  Patient mentions a distant episode of chest pain about 8 years ago during which he experienced severe crushing chest pain radiating into the left arm; the chest pain eventually resolved but the left arm pain persisted for about a week -this was not evaluated by any provider.  Similarly, years ago he reports having experienced claudication like symptoms, states his legs used to feel "like lead" when walking and would "lock up"; patient suspects he may have PAD from smoking.  Denies recent chest pain, dyspnea, or claudication.  He is due for AAA and LDCT screening, neither of which has ever been performed.  Unrelated, urology wants to biopsy the mass of his prostate, but he vehemently opposes and states there is "no needle going up my backside, never".  He states all males in his family have enlarged prostate, and asks if there is any medication I can give for it.  Denies any urinary symptoms such as frequency, hesitancy, weak stream, nocturia.   Medication list has been reviewed and updated.  Current Meds  Medication Sig   atorvastatin (LIPITOR) 10 MG tablet Take 1 tablet (10 mg total) by mouth daily.   nicotine (NICODERM CQ - DOSED IN MG/24 HOURS) 21 mg/24hr patch Place 1 patch (21 mg total) onto the skin daily. Apply patch to clean, dry skin in the morning and remove each night before bed.  Stop cigarette use completely while on the patch.   olmesartan-hydrochlorothiazide (BENICAR HCT) 40-25 MG tablet Take 1 tablet by mouth daily.   [DISCONTINUED] gabapentin (NEURONTIN) 300 MG capsule Take 1 capsule (300 mg total) by mouth 3 (three) times daily. Day 1 - Take once. Day 2 - Take twice. Day 3 and after - Take up to three times daily   [DISCONTINUED] olmesartan-hydrochlorothiazide (BENICAR HCT) 40-12.5 MG tablet Take 1 tablet by mouth daily.     Review of Systems  Constitutional:  Negative for fatigue and fever.  Respiratory:  Negative for chest tightness and shortness of breath.   Cardiovascular:  Negative for chest pain and palpitations.  Gastrointestinal:  Negative for abdominal pain.    Patient Active Problem List   Diagnosis Date Noted   Mixed hyperlipidemia 08/21/2022   Low back pain radiating to left lower extremity 07/24/2022   Degenerative arthritis of lumbar spine 07/24/2022   Spondylosis of lumbar spine 07/24/2022   Osteoarthritis, multiple sites 07/24/2022   Elevated PSA 06/18/2022   GAD (generalized anxiety disorder) 06/18/2022   Primary hypertension 05/13/2022   Tobacco use disorder 05/13/2022   Chronic bronchitis (HCC) 05/13/2022   Overweight 05/13/2022    Allergies  Allergen Reactions   Other Anaphylaxis    Iron medication that starts with an "A"   Sudafed [Pseudoephedrine Hcl]     Unknown   Percocet [Oxycodone-Acetaminophen] Rash    Immunization History  Administered  Date(s) Administered   Tdap 04/07/2016    Past Surgical History:  Procedure Laterality Date   DENTAL SURGERY     I & D EXTREMITY Right 04/07/2016   Procedure: IRRIGATION AND DEBRIDEMENT RIGHT INDEX FINGER WITH REPAIRS AND RECONSTRUCTION AS NECESSARY;  Surgeon: Dairl Ponder, MD;  Location: [facility] OR;  Service: Orthopedics;  Laterality: Right;    Social History   Tobacco Use   Smoking status: Every Day    Packs/day: 1.50    Years: 50.00    Additional pack years: 0.00     Total pack years: 75.00    Types: Cigarettes   Smokeless tobacco: Never  Vaping Use   Vaping Use: Never used  Substance Use Topics   Alcohol use: No   Drug use: No    Family History  Problem Relation Age of Onset   Arthritis Mother    Hypertension Father    COPD Father    Cancer Father    Melanoma Father    Diabetes Sister    Cancer Sister    Thyroid cancer Brother         08/21/2022    1:20 PM 06/18/2022    9:29 AM  GAD 7 : Generalized Anxiety Score  Nervous, Anxious, on Edge 0 0  Control/stop worrying 0 0  Worry too much - different things 0 0  Trouble relaxing 0 0  Restless 0 0  Easily annoyed or irritable 0 2  Afraid - awful might happen 0 3  Total GAD 7 Score 0 5  Anxiety Difficulty Not difficult at all Somewhat difficult       08/21/2022    1:19 PM 06/18/2022    9:29 AM  Depression screen PHQ 2/9  Decreased Interest 1 0  Down, Depressed, Hopeless 0 0  PHQ - 2 Score 1 0  Altered sleeping 0 3  Tired, decreased energy 0 0  Change in appetite 0 0  Feeling bad or failure about yourself  0 0  Trouble concentrating 0 0  Moving slowly or fidgety/restless 0 0  Suicidal thoughts 0 0  PHQ-9 Score 1 3  Difficult doing work/chores Not difficult at all Not difficult at all    BP Readings from Last 3 Encounters:  08/21/22 118/88  07/24/22 138/86  07/05/22 (!) 152/94    Wt Readings from Last 3 Encounters:  08/21/22 173 lb (78.5 kg)  07/24/22 172 lb (78 kg)  07/05/22 171 lb (77.6 kg)    BP 118/88 (BP Location: Left Arm, Patient Position: Sitting, Cuff Size: Large)   Pulse 95   Temp 98 F (36.7 C) (Oral)   Ht 5' 7.5" (1.715 m)   Wt 173 lb (78.5 kg)   SpO2 98%   BMI 26.70 kg/m   Physical Exam Vitals and nursing note reviewed.  Constitutional:      Appearance: Normal appearance.  HENT:     Ears:     Comments: EAC clear bilaterally with good view of TM which is without effusion or erythema.     Nose: Nose normal.     Mouth/Throat:     Mouth: Mucous  membranes are moist. No oral lesions.     Dentition: Normal dentition.     Pharynx: No posterior oropharyngeal erythema.  Eyes:     Extraocular Movements: Extraocular movements intact.     Conjunctiva/sclera: Conjunctivae normal.     Pupils: Pupils are equal, round, and reactive to light.  Neck:     Thyroid: No thyromegaly.  Cardiovascular:  Rate and Rhythm: Normal rate and regular rhythm.     Heart sounds: No murmur heard.    No friction rub. No gallop.     Comments: Pulses 2+ at radial, PT, DP bilaterally. No carotid bruit. No peripheral edema Pulmonary:     Effort: Pulmonary effort is normal.     Breath sounds: Decreased breath sounds present. No wheezing or rales.  Chest:     Comments: Soft mobile 2 cm mass of right lateral rib cage possibly lipoma. Abdominal:     General: Bowel sounds are normal. There is no distension.     Palpations: Abdomen is soft. There is no mass.     Tenderness: There is no abdominal tenderness.  Genitourinary:    Comments: Deferred. Reviewed self-exam technique Musculoskeletal:        General: Normal range of motion.     Comments: Full ROM with strength 5/5 bilateral upper and lower extremities  Lymphadenopathy:     Cervical: No cervical adenopathy.  Skin:    General: Skin is warm and dry.     Capillary Refill: Capillary refill takes less than 2 seconds.     Findings: No lesion or rash.  Neurological:     Mental Status: He is alert and oriented to person, place, and time.     Gait: Gait is intact.  Psychiatric:        Mood and Affect: Mood and affect normal.        Behavior: Behavior normal.    EKG: NSR with possible old inferior infarct which would be consistent with history.  No ST changes, no axis deviation.  Recent Labs     Component Value Date/Time   NA 141 06/18/2022 1012   K 4.3 06/18/2022 1012   CL 105 06/18/2022 1012   CO2 27 06/18/2022 1012   GLUCOSE 99 06/18/2022 1012   BUN 23 06/18/2022 1012   CREATININE 1.25 06/18/2022  1012   CALCIUM 9.5 06/18/2022 1012   PROT 6.9 05/13/2022 0838   ALBUMIN 4.4 05/13/2022 0838   AST 13 05/13/2022 0838   ALT 13 05/13/2022 0838   ALKPHOS 84 05/13/2022 0838   BILITOT 0.3 05/13/2022 0838   GFRNONAA >60 04/07/2016 0600   GFRAA >60 04/07/2016 0600    Lab Results  Component Value Date   WBC 7.0 05/13/2022   HGB 14.5 05/13/2022   HCT 43.1 05/13/2022   MCV 91.6 05/13/2022   PLT 310.0 05/13/2022   Lab Results  Component Value Date   HGBA1C 6.4 05/13/2022   Lab Results  Component Value Date   CHOL 192 05/13/2022   HDL 41.60 05/13/2022   LDLCALC 117 (H) 05/13/2022   TRIG 166.0 (H) 05/13/2022   CHOLHDL 5 05/13/2022   Lab Results  Component Value Date   TSH 2.20 05/13/2022     Assessment and Plan:  1. Preoperative examination Patient cleared for partial knee replacement, assuming he is successful in smoking cessation.  Emphasized with patient's the importance of quitting smoking, otherwise his surgeon may postpone the procedure.  He verbalizes understanding and will restart patches today. - EKG 12-Lead  2. Primary hypertension Blood pressure controlled not for surgery, diastolic still slightly high, making slight adjustment to the HCTZ component of current medication.  New prescription as below.  Encouraged monitoring at home, and in fact printed a screenshot of [employer] website where they have arm cuffs for $25 or less on sale. - olmesartan-hydrochlorothiazide (BENICAR HCT) 40-25 MG tablet; Take 1 tablet by mouth daily.  Dispense: 90 tablet; Refill: 1  3. Tobacco use disorder Emphasized with patient's the importance of quitting smoking, otherwise his surgeon may postpone the procedure.  He verbalizes understanding and will restart patches today.  4. Mixed hyperlipidemia Recent lipids show LDL 117 which is really not so bad, but given his heavy smoking history and comorbid HTN, he really should be on a statin for primary (possibly secondary) prevention of CVD.   LDL goal less than 70.  Initiating atorvastatin today.  The 10-year ASCVD risk score (Arnett DK, et al., 2019) is: 19%   - atorvastatin (LIPITOR) 10 MG tablet; Take 1 tablet (10 mg total) by mouth daily.  Dispense: 90 tablet; Refill: 1  5. Screening for lung cancer Sending for initial LDCT. This will likely also aid in evaluation of possible CAD/atherosclerosis.  - Ambulatory Referral Lung Cancer Screening [facility]  6. Encounter for screening for abdominal aortic aneurysm (AAA) in patient 8 years of age or older with history of smoking Sending for one-time screening ultrasound of aorta - US AORTA  7. Enlarged prostate Will correspond with urology informing them that he desires pharmacotherapy, probably finasteride or similar given the absence of active urinary symptoms.    Return in about 3 months (around 11/21/2022) for OV f/u chronic conditions.   Partially dictated using Animal nutritionist. Any errors are unintentional.  Alvester Morin, PA-C, DMSc, Nutritionist [facility] [phone]

## 2022-08-26 ENCOUNTER — Ambulatory Visit
Admission: RE | Admit: 2022-08-26 | Discharge: 2022-08-26 | Disposition: A | Discharge status: Home / Self Care | Payer: Medicare Other | Source: Ambulatory Visit | Attending: Physician Assistant | Admitting: Physician Assistant

## 2022-08-26 DIAGNOSIS — Z87891 Personal history of nicotine dependence: Secondary | ICD-10-CM | POA: Insufficient documentation

## 2022-08-26 DIAGNOSIS — Z136 Encounter for screening for cardiovascular disorders: Secondary | ICD-10-CM | POA: Diagnosis not present

## 2022-09-04 ENCOUNTER — Other Ambulatory Visit: Payer: Medicare Other | Admitting: Urology

## 2022-09-10 DIAGNOSIS — H25813 Combined forms of age-related cataract, bilateral: Secondary | ICD-10-CM | POA: Diagnosis not present

## 2022-09-10 DIAGNOSIS — S52509A Unspecified fracture of the lower end of unspecified radius, initial encounter for closed fracture: Secondary | ICD-10-CM | POA: Insufficient documentation

## 2022-09-10 LAB — HM DIABETES EYE EXAM

## 2022-09-13 LAB — COLOGUARD: COLOGUARD: NEGATIVE

## 2022-09-18 ENCOUNTER — Telehealth: Payer: Self-pay | Admitting: Physician Assistant

## 2022-09-18 ENCOUNTER — Other Ambulatory Visit: Payer: Self-pay

## 2022-09-18 DIAGNOSIS — R079 Chest pain, unspecified: Secondary | ICD-10-CM

## 2022-09-18 NOTE — Telephone Encounter (Signed)
Pt's daughter Morrie Sheldon is calling in requesting to speak with a nurse or pt's PCP. Morrie Sheldon says pt was scheduled for surgery this morning and says they were told today that pt couldn't have surgery because he needed a cardiac clearance. Please follow up with Morrie Sheldon [phone].

## 2022-09-18 NOTE — Telephone Encounter (Signed)
Called Morrie Sheldon she stated pt was not able to get surgery today. Called [facility] waiting on a phone call back.  KP

## 2022-09-19 ENCOUNTER — Telehealth: Payer: Self-pay | Admitting: Physician Assistant

## 2022-09-19 NOTE — Telephone Encounter (Signed)
Called Morrie Sheldon told her a referral has already been placed. She asked if her dad could take Vicodin that he had prescribed for the surgery for pain told her we do not recommend he take that medication. She verbalized understanding.  KP

## 2022-09-19 NOTE — Telephone Encounter (Signed)
Pt's daughter Morrie Sheldon called again today saying [facility] say they had to postpone the surgery because he needs to see a cardiologist.  She said he needs the referral asap if that is what is holding up his surgery.  She said her father needs the knee replacement asap.  CB#  [id number]

## 2022-09-20 NOTE — Telephone Encounter (Signed)
Spoke to [place] she is aware that a referral for cardiology has been placed. [facility] called back and stated that the pt stated he had left sided chest pain that went into his arm when he was called for a screening before his surgery. He now needs to get a cardiac clearance before  his surgery. Pt and Morrie Sheldon is aware.  KP

## 2022-12-02 ENCOUNTER — Encounter: Payer: Self-pay | Admitting: Cardiology

## 2022-12-02 ENCOUNTER — Ambulatory Visit: Payer: Medicare HMO | Attending: Cardiology | Admitting: Cardiology

## 2022-12-02 ENCOUNTER — Ambulatory Visit: Payer: Medicare HMO | Attending: Cardiology

## 2022-12-02 VITALS — BP 144/92 | HR 92 | Ht 66.0 in | Wt 189.2 lb

## 2022-12-02 DIAGNOSIS — E782 Mixed hyperlipidemia: Secondary | ICD-10-CM | POA: Diagnosis not present

## 2022-12-02 DIAGNOSIS — R072 Precordial pain: Secondary | ICD-10-CM

## 2022-12-02 DIAGNOSIS — I10 Essential (primary) hypertension: Secondary | ICD-10-CM | POA: Diagnosis not present

## 2022-12-02 DIAGNOSIS — Z01810 Encounter for preprocedural cardiovascular examination: Secondary | ICD-10-CM | POA: Diagnosis not present

## 2022-12-02 DIAGNOSIS — F17200 Nicotine dependence, unspecified, uncomplicated: Secondary | ICD-10-CM | POA: Diagnosis not present

## 2022-12-02 LAB — ECHOCARDIOGRAM COMPLETE
Area-P 1/2: 3.85 cm2
Height: 66 in
S' Lateral: 2.5 cm
Weight: 3027.2 [oz_av]

## 2022-12-02 MED ORDER — IVABRADINE HCL 5 MG PO TABS: 15.0000 mg | ORAL_TABLET | Freq: Once | ORAL | 0 refills | Status: AC

## 2022-12-02 MED ORDER — ATORVASTATIN CALCIUM 40 MG PO TABS: 40.0000 mg | ORAL_TABLET | Freq: Every day | ORAL | 0 refills | Status: DC

## 2022-12-02 MED ORDER — METOPROLOL TARTRATE 100 MG PO TABS: 100.0000 mg | ORAL_TABLET | Freq: Once | ORAL | 0 refills | Status: DC

## 2022-12-02 NOTE — Progress Notes (Signed)
Cardiology Office Note:    Date:  12/02/2022   ID:  Riley Anderson, DOB [birth year], MRN [id number]  PCP:  Remo Lipps, PA   [facility] Providers Cardiologist:  Debbe Odea, MD     Referring MD: Remo Lipps, PA   Chief Complaint  Patient presents with   New Patient (Initial Visit)    Referred for cardiac evaluation for chest pain.  Denies recent chest pain.  Patient reports history of heart attack 6 years ago but did not go for evaluation.  Needing left knee surgery but pending cardiac evaluation.      History of Present Illness:    Riley Anderson is a 67 y.o. male with a hx of hypertension, hyperlipidemia, current smoker x 50+ years presenting with chest pain.  Complains of chest pain ongoing several years now.  States having a heart attack about 7 years ago when he had symptoms of chest pain with numbness going down his left arm.  Symptoms went on for about 1 week.  Did not seek medical care due to lack of insurance.  Recently started on BP and cholesterol medications.  He still smokes.  He has left knee arthritis, left knee surgery being planned.  Advised not to take aspirin in anticipation for surgery.  Past Medical History:  Diagnosis Date   Arthritis    Chronic bronchitis (HCC)    Hypertension     Past Surgical History:  Procedure Laterality Date   DENTAL SURGERY     I & D EXTREMITY Right 04/07/2016   Procedure: IRRIGATION AND DEBRIDEMENT RIGHT INDEX FINGER WITH REPAIRS AND RECONSTRUCTION AS NECESSARY;  Surgeon: Dairl Ponder, MD;  Location: [facility] OR;  Service: Orthopedics;  Laterality: Right;    Current Medications: Current Meds  Medication Sig   ivabradine (CORLANOR) 5 MG TABS tablet Take 3 tablets (15 mg total) by mouth once for 1 dose. TWO HOURS PRIOR TO CARDIAC CTA   metoprolol tartrate (LOPRESSOR) 100 MG tablet Take 1 tablet (100 mg total) by mouth once for 1 dose. TWO HOURS PRIOR TO CARDIAC CTA   olmesartan-hydrochlorothiazide (BENICAR  HCT) 40-25 MG tablet Take 1 tablet by mouth daily.   [DISCONTINUED] atorvastatin (LIPITOR) 10 MG tablet Take 1 tablet (10 mg total) by mouth daily.     Allergies:   Other, Sudafed [pseudoephedrine hcl], and Percocet [oxycodone-acetaminophen]   Social History   Socioeconomic History   Marital status: Legally Separated    Spouse name: [name]   Number of children: 2   Years of education: Not on file   Highest education level: 11th grade  Occupational History   Not on file  Tobacco Use   Smoking status: Every Day    Current packs/day: 1.50    Average packs/day: 1.5 packs/day for 50.0 years (75.0 ttl pk-yrs)    Types: Cigarettes   Smokeless tobacco: Never  Vaping Use   Vaping status: Never Used  Substance and Sexual Activity   Alcohol use: No   Drug use: No   Sexual activity: Not Currently  Other Topics Concern   Not on file  Social History Narrative   [birth year]   36      Retired, works on the side Heavy Research scientist (life sciences)   Social Determinants of Corporate investment banker Strain: Low Risk  (06/17/2022)   Overall Financial Resource Strain (CARDIA)    Difficulty of Paying Living Expenses: Not hard at all  Food Insecurity: No Food Insecurity (06/17/2022)   Hunger Vital Sign  Worried About Programme researcher, broadcasting/film/video in the Last Year: Never true    Ran Out of Food in the Last Year: Never true  Transportation Needs: No Transportation Needs (06/17/2022)   PRAPARE - Administrator, Civil Service (Medical): No    Lack of Transportation (Non-Medical): No  Physical Activity: Unknown (06/17/2022)   Exercise Vital Sign    Days of Exercise per Week: 0 days    Minutes of Exercise per Session: Not on file  Stress: Stress Concern Present (06/17/2022)   [employer] of Occupational Health - Occupational Stress Questionnaire    Feeling of Stress : Rather much  Social Connections: Socially Isolated (06/17/2022)   Social Connection and Isolation Panel [NHANES]    Frequency of  Communication with Friends and Family: More than three times a week    Frequency of Social Gatherings with Friends and Family: More than three times a week    Attends Religious Services: Never    Database administrator or Organizations: No    Attends Engineer, structural: Not on file    Marital Status: Separated     Family History: The patient's family history includes Arthritis in his mother; COPD in his father; Cancer in his father and sister; Diabetes in his sister; Heart disease in his father; Hypertension in his father; Melanoma in his father; Thyroid cancer in his brother.  ROS:   Please see the history of present illness.     All other systems reviewed and are negative.  EKGs/Labs/Other Studies Reviewed:    The following studies were reviewed today:  EKG Interpretation Date/Time:  Monday December 02 2022 11:53:44 EDT Ventricular Rate:  92 PR Interval:    QRS Duration:  82 QT Interval:  324 QTC Calculation: 400 R Axis:   -65  Text Interpretation: baseline artifacts, likely sinus rhythm Left axis deviation Confirmed by Debbe Odea ([id number]) on 12/02/2022 12:04:47 PM    Recent Labs: 05/13/2022: ALT 13; Hemoglobin 14.5; Platelets 310.0; TSH 2.20 06/18/2022: BUN 23; Creatinine, Ser 1.25; Potassium 4.3; Sodium 141  Recent Lipid Panel    Component Value Date/Time   CHOL 192 05/13/2022 0838   TRIG 166.0 (H) 05/13/2022 0838   HDL 41.60 05/13/2022 0838   CHOLHDL 5 05/13/2022 0838   VLDL 33.2 05/13/2022 0838   LDLCALC 117 (H) 05/13/2022 0838     Risk Assessment/Calculations:     HYPERTENSION CONTROL Vitals:   12/02/22 1147 12/02/22 1156  BP: (!) 138/96 (!) 144/92    The patient's blood pressure is elevated above target today.  In order to address the patient's elevated BP: Blood pressure will be monitored at home to determine if medication changes need to be made.            Physical Exam:    VS:  BP (!) 144/92 (BP Location: Right Arm, Patient  Position: Sitting, Cuff Size: Normal)   Pulse 92   Ht 5\' 6"  (1.676 m)   Wt 189 lb 3.2 oz (85.8 kg)   SpO2 97%   BMI 30.54 kg/m     Wt Readings from Last 3 Encounters:  12/02/22 189 lb 3.2 oz (85.8 kg)  08/21/22 173 lb (78.5 kg)  07/24/22 172 lb (78 kg)     GEN:  Well nourished, well developed in no acute distress HEENT: Normal NECK: No JVD; No carotid bruits CARDIAC: RRR, no murmurs, rubs, gallops RESPIRATORY:  Clear to auscultation without rales, wheezing or rhonchi  ABDOMEN: Soft, non-tender, non-distended MUSCULOSKELETAL:  No edema; No deformity  SKIN: Warm and dry NEUROLOGIC:  Alert and oriented x 3 PSYCHIATRIC:  Normal affect   ASSESSMENT:    1. Precordial pain   2. Primary hypertension   3. Mixed hyperlipidemia   4. Pre-procedural cardiovascular examination   5. Smoking    PLAN:    In order of problems listed above:  Chest pain, several risk factors.  Obtain echo, obtain coronary CT. Hypertension, BP elevated, left knee pain possibly contributing.  Continue olmesartan 40, HCTZ 25.  If BP stays elevated, consider adding Norvasc. Hyperlipidemia, increase Lipitor 40 mg daily. Preprocedural exam, left knee surgery being planned.  Current chest pain.  Echo and coronary CT as above.  Further recs pending testing results. Smoking, cessation advised.  Follow-up after cardiac testing      Medication Adjustments/Labs and Tests Ordered: Current medicines are reviewed at length with the patient today.  Concerns regarding medicines are outlined above.  Orders Placed This Encounter  Procedures   CT CORONARY MORPH W/CTA COR W/SCORE W/CA W/CM &/OR WO/CM   Basic Metabolic Panel (BMET)   EKG 12-Lead   ECHOCARDIOGRAM COMPLETE   Meds ordered this encounter  Medications   atorvastatin (LIPITOR) 40 MG tablet    Sig: Take 1 tablet (40 mg total) by mouth daily.    Dispense:  90 tablet    Refill:  0   metoprolol tartrate (LOPRESSOR) 100 MG tablet    Sig: Take 1 tablet (100  mg total) by mouth once for 1 dose. TWO HOURS PRIOR TO CARDIAC CTA    Dispense:  1 tablet    Refill:  0   ivabradine (CORLANOR) 5 MG TABS tablet    Sig: Take 3 tablets (15 mg total) by mouth once for 1 dose. TWO HOURS PRIOR TO CARDIAC CTA    Dispense:  3 tablet    Refill:  0    CASH PAY!    Patient Instructions  Medication Instructions:   INCREASE Atorvastatin - Take one tablet ( 40mg ) by mouth daily.   *If you need a refill on your cardiac medications before your next appointment, please call your pharmacy*   Lab Work:  Your physician recommends you have labs today - BMP  If you have labs (blood work) drawn today and your tests are completely normal, you will receive your results only by: MyChart Message (if you have MyChart) OR A paper copy in the mail If you have any lab test that is abnormal or we need to change your treatment, we will call you to review the results.   Testing/Procedures:  Your physician has requested that you have an echocardiogram. Echocardiography is a painless test that uses sound waves to create images of your heart. It provides your doctor with information about the size and shape of your heart and how well your heart's chambers and valves are working. This procedure takes approximately one hour. There are no restrictions for this procedure. Please do NOT wear cologne, perfume, aftershave, or lotions (deodorant is allowed). Please arrive 15 minutes prior to your appointment time.   2. Your cardiac CT will be scheduled at one of the below locations:   [facility] [address] [place], [place] [address] [phone]  OR   [facility] Heart and Vascular entrance [address] [place], [place] [address] [phone]  If scheduled at [facility] or [facility], please arrive 15 mins early for check-in and test prep.  There is spacious parking and easy  access to the radiology department from the [facility] Heart and Vascular entrance. Please enter here and check-in with the desk attendant.   Please follow these instructions carefully (unless otherwise directed):  An IV will be required for this test and Nitroglycerin will be given.  Hold all erectile dysfunction medications at least 3 days (72 hrs) prior to test. (Ie viagra, cialis, sildenafil, tadalafil, etc)   On the Night Before the Test: Be sure to Drink plenty of water. Do not consume any caffeinated/decaffeinated beverages or chocolate 12 hours prior to your test. Do not take any antihistamines 12 hours prior to your test.  On the Day of the Test: Drink plenty of water until 1 hour prior to the test. Do not eat any food 1 hour prior to test. Take metoprolol (Lopressor) two hours prior to test. HOLD olmesartan-hydrochlorothiazide (BENICAR HCT) 40-25 MG tablet  You may take your regular medications prior to the test.   After the Test: Drink plenty of water. After receiving IV contrast, you may experience a mild flushed feeling. This is normal. On occasion, you may experience a mild rash up to 24 hours after the test. This is not dangerous. If this occurs, you can take Benadryl 25 mg and increase your fluid intake. If you experience trouble breathing, this can be serious. If it is severe call 911 IMMEDIATELY. If it is mild, please call our office. If you take any of these medications: Glipizide/Metformin, Avandament, Glucavance, please do not take 48 hours after completing test unless otherwise instructed.  We will call to schedule your test 2-4 weeks out understanding that some insurance companies will need an authorization prior to the service being performed.   For more information and frequently asked questions, please visit our website : [email or web address]  For non-scheduling related questions, please contact the cardiac imaging nurse navigator should you have any  questions/concerns: Cardiac Imaging Nurse Navigators Direct Office Dial: [phone]   For scheduling needs, including cancellations and rescheduling, please call [place], [phone].   Follow-Up: At [facility], you and your health needs are our priority.  As part of our continuing mission to provide you with exceptional heart care, we have created designated Provider Care Teams.  These Care Teams include your primary Cardiologist (physician) and Advanced Practice Providers (APPs -  Physician Assistants and Nurse Practitioners) who all work together to provide you with the care you need, when you need it.  We recommend signing up for the patient portal called "MyChart".  Sign up information is provided on this After Visit Summary.  MyChart is used to connect with patients for Virtual Visits (Telemedicine).  Patients are able to view lab/test results, encounter notes, upcoming appointments, etc.  Non-urgent messages can be sent to your provider as well.   To learn more about what you can do with MyChart, go to [email or web address].    Your next appointment:   6 week(s)  Provider:   Debbe Odea, MD ONLY       Signed, Debbe Odea, MD  12/02/2022 2:01 PM    [facility]

## 2022-12-02 NOTE — Patient Instructions (Signed)
Medication Instructions:   INCREASE Atorvastatin - Take one tablet ( 40mg ) by mouth daily.   *If you need a refill on your cardiac medications before your next appointment, please call your pharmacy*   Lab Work:  Your physician recommends you have labs today - BMP  If you have labs (blood work) drawn today and your tests are completely normal, you will receive your results only by: MyChart Message (if you have MyChart) OR A paper copy in the mail If you have any lab test that is abnormal or we need to change your treatment, we will call you to review the results.   Testing/Procedures:  Your physician has requested that you have an echocardiogram. Echocardiography is a painless test that uses sound waves to create images of your heart. It provides your doctor with information about the size and shape of your heart and how well your heart's chambers and valves are working. This procedure takes approximately one hour. There are no restrictions for this procedure. Please do NOT wear cologne, perfume, aftershave, or lotions (deodorant is allowed). Please arrive 15 minutes prior to your appointment time.   2. Your cardiac CT will be scheduled at one of the below locations:   [facility] [address] [place], [place] [address] [phone]  OR   [facility] Heart and Vascular entrance [address] [place], [place] [address] [phone]  If scheduled at [facility] or [facility], please arrive 15 mins early for check-in and test prep.  There is spacious parking and easy access to the radiology department from the [facility] Heart and Vascular entrance. Please enter here and check-in with the desk attendant.   Please follow these instructions carefully (unless otherwise directed):  An IV will be required for this test and Nitroglycerin will be given.  Hold all erectile dysfunction medications  at least 3 days (72 hrs) prior to test. (Ie viagra, cialis, sildenafil, tadalafil, etc)   On the Night Before the Test: Be sure to Drink plenty of water. Do not consume any caffeinated/decaffeinated beverages or chocolate 12 hours prior to your test. Do not take any antihistamines 12 hours prior to your test.  On the Day of the Test: Drink plenty of water until 1 hour prior to the test. Do not eat any food 1 hour prior to test. Take metoprolol (Lopressor) two hours prior to test. HOLD olmesartan-hydrochlorothiazide (BENICAR HCT) 40-25 MG tablet  You may take your regular medications prior to the test.   After the Test: Drink plenty of water. After receiving IV contrast, you may experience a mild flushed feeling. This is normal. On occasion, you may experience a mild rash up to 24 hours after the test. This is not dangerous. If this occurs, you can take Benadryl 25 mg and increase your fluid intake. If you experience trouble breathing, this can be serious. If it is severe call 911 IMMEDIATELY. If it is mild, please call our office. If you take any of these medications: Glipizide/Metformin, Avandament, Glucavance, please do not take 48 hours after completing test unless otherwise instructed.  We will call to schedule your test 2-4 weeks out understanding that some insurance companies will need an authorization prior to the service being performed.   For more information and frequently asked questions, please visit our website : [email or web address]  For non-scheduling related questions, please contact the cardiac imaging nurse navigator should you have any questions/concerns: Cardiac Imaging Nurse Navigators Direct Office  Dial: [phone]   For scheduling needs, including cancellations and rescheduling, please call [place], [phone].   Follow-Up: At [facility], you and your health needs are our priority.  As part of our continuing mission to provide you  with exceptional heart care, we have created designated Provider Care Teams.  These Care Teams include your primary Cardiologist (physician) and Advanced Practice Providers (APPs -  Physician Assistants and Nurse Practitioners) who all work together to provide you with the care you need, when you need it.  We recommend signing up for the patient portal called "MyChart".  Sign up information is provided on this After Visit Summary.  MyChart is used to connect with patients for Virtual Visits (Telemedicine).  Patients are able to view lab/test results, encounter notes, upcoming appointments, etc.  Non-urgent messages can be sent to your provider as well.   To learn more about what you can do with MyChart, go to [email or web address].    Your next appointment:   6 week(s)  Provider:   Debbe Odea, MD ONLY

## 2022-12-03 ENCOUNTER — Encounter (HOSPITAL_COMMUNITY): Payer: Self-pay

## 2022-12-03 LAB — BASIC METABOLIC PANEL
BUN/Creatinine Ratio: 17 (ref 10–24)
BUN: 26 mg/dL (ref 8–27)
CO2: 22 mmol/L (ref 20–29)
Calcium: 9.3 mg/dL (ref 8.6–10.2)
Chloride: 106 mmol/L (ref 96–106)
Creatinine, Ser: 1.56 mg/dL — ABNORMAL HIGH (ref 0.76–1.27)
Glucose: 145 mg/dL — ABNORMAL HIGH (ref 70–99)
Potassium: 4.4 mmol/L (ref 3.5–5.2)
Sodium: 143 mmol/L (ref 134–144)
eGFR: 49 mL/min/{1.73_m2} — ABNORMAL LOW (ref >59)

## 2022-12-04 ENCOUNTER — Encounter: Payer: Self-pay | Admitting: Cardiology

## 2022-12-05 ENCOUNTER — Ambulatory Visit
Admission: RE | Admit: 2022-12-05 | Discharge: 2022-12-05 | Disposition: A | Discharge status: Home / Self Care | Payer: Medicare HMO | Source: Ambulatory Visit | Attending: Cardiology | Admitting: Cardiology

## 2022-12-05 DIAGNOSIS — Z01810 Encounter for preprocedural cardiovascular examination: Secondary | ICD-10-CM | POA: Insufficient documentation

## 2022-12-05 DIAGNOSIS — F17210 Nicotine dependence, cigarettes, uncomplicated: Secondary | ICD-10-CM | POA: Diagnosis not present

## 2022-12-05 DIAGNOSIS — E782 Mixed hyperlipidemia: Secondary | ICD-10-CM | POA: Diagnosis not present

## 2022-12-05 DIAGNOSIS — I10 Essential (primary) hypertension: Secondary | ICD-10-CM | POA: Insufficient documentation

## 2022-12-05 DIAGNOSIS — R072 Precordial pain: Secondary | ICD-10-CM | POA: Diagnosis not present

## 2022-12-05 DIAGNOSIS — R079 Chest pain, unspecified: Secondary | ICD-10-CM | POA: Diagnosis not present

## 2022-12-05 MED ORDER — IOHEXOL 350 MG/ML SOLN
75.0000 mL | Freq: Once | INTRAVENOUS | Status: AC | PRN
  Administered 2022-12-05: 75 mL via INTRAVENOUS

## 2022-12-05 MED ORDER — SODIUM CHLORIDE 0.9 % IV SOLN: INTRAVENOUS | Status: DC

## 2022-12-05 MED ORDER — NITROGLYCERIN 0.4 MG SL SUBL
0.8000 mg | SUBLINGUAL_TABLET | Freq: Once | SUBLINGUAL | Status: AC
  Administered 2022-12-05: 0.8 mg via SUBLINGUAL

## 2022-12-05 NOTE — Progress Notes (Signed)
Pt tolerated procedure well with no issues. Pt ABCs intact. Pt denies any complaints. Pt encouraged to drink plenty of water throughout the day. Pt ambulatory with steady gait.

## 2022-12-09 ENCOUNTER — Ambulatory Visit: Payer: Self-pay | Admitting: *Deleted

## 2022-12-09 ENCOUNTER — Other Ambulatory Visit: Payer: Self-pay

## 2022-12-09 ENCOUNTER — Telehealth: Payer: Self-pay

## 2022-12-09 ENCOUNTER — Other Ambulatory Visit: Payer: Self-pay | Admitting: Physician Assistant

## 2022-12-09 DIAGNOSIS — I10 Essential (primary) hypertension: Secondary | ICD-10-CM

## 2022-12-09 DIAGNOSIS — Z79899 Other long term (current) drug therapy: Secondary | ICD-10-CM

## 2022-12-09 MED ORDER — OLMESARTAN MEDOXOMIL 40 MG PO TABS
40.0000 mg | ORAL_TABLET | Freq: Every day | ORAL | 1 refills | Status: DC
Start: 2022-12-09 — End: 2023-05-26

## 2022-12-09 MED ORDER — AMLODIPINE BESYLATE 10 MG PO TABS
10.0000 mg | ORAL_TABLET | Freq: Every day | ORAL | 1 refills | Status: DC
Start: 2022-12-09 — End: 2023-01-01

## 2022-12-09 NOTE — Telephone Encounter (Signed)
Results have been placed on mychart and has been reviewed by patient.

## 2022-12-09 NOTE — Telephone Encounter (Signed)
-----   Message from Debbe Odea sent at 12/09/2022  7:50 AM EDT ----- Cr elevated. Previously normal. Encourage adequate hydration with water. Recheck bmp in 10 days.

## 2022-12-09 NOTE — Telephone Encounter (Signed)
  Chief Complaint: Wants in fluid pill removed from his high BP medicine  (Benicar-HCT) Symptoms: The drug store told him the fluid pill in in two in one high BP medicine was messing up his kidneys.   He demanded the fluid pill be removed. Frequency: Today Pertinent Negatives: Patient denies wanting to wait for repeat blood work in 10 days to recheck his kidneys.    "I want that fluid pill removed now from my medicine because it's messing up my kidneys".   Disposition: [] ED /[] Urgent Care (no appt availability in office) / [] Appointment(In office/virtual)/ []  [facility]/ [] Home Care/ [] Refused Recommended Disposition /[] [facility]/ [x]  Follow-up with PCP Additional Notes: I have sent a message to Tillie Fantasia, PA letting him know pt's request.    Pt said something about wanting to talk with Reuel Boom about losing weight.   I asked if he wanted an appt.   "I just want that fluid pill removed from my high BP medicine" and hung up.

## 2022-12-09 NOTE — Telephone Encounter (Signed)
Message from Clide Dales sent at 12/09/2022  3:00 PM EDT  Summary: Medication Question   Patient has some question about olmesartan-hydrochlorothiazide (BENICAR HCT) 40-25 MG tablet. He was told by the pharmacy that it is messing up his kidneys. Please advise.          Call History  Contact Date/Time Type Contact Phone/Fax User  12/09/2022 02:59 PM EDT Phone (Incoming) Saud, Bail (Self) [phone] (M) Mabe, Hennie Duos   Reason for Disposition  [1] Caller has URGENT medicine question about med that PCP or specialist prescribed AND [2] triager unable to answer question  Answer Assessment - Initial Assessment Questions 1. NAME of MEDICINE: "What medicine(s) are you calling about?"     Benicar-HCT 40-25 mg 2. QUESTION: "What is your question?" (e.g., double dose of medicine, side effect)     My high BP is a two in one medication.   I don't want the water pill that is with the high BP medication.   I want only the high BP medicine but not the water pill.   3. PRESCRIBER: "Who prescribed the medicine?" Reason: if prescribed by specialist, call should be referred to that group.     Tillie Fantasia, PA 4. SYMPTOMS: "Do you have any symptoms?" If Yes, ask: "What symptoms are you having?"  "How bad are the symptoms (e.g., mild, moderate, severe)     The drug store told me that fluid pill in my high BP medicine is messing up my kidneys.   I want that fluid pill taken out of my high BP medicine.  A lady in the background said he was for blood work in a few days to recheck his kidneys but pt is demanding the fluid pill be removed now from his medication. 5. PREGNANCY:  "Is there any chance that you are pregnant?" "When was your last menstrual period?"     N/A I have sent a message to Tillie Fantasia, PA.  Protocols used: Medication Question Call-A-AH

## 2022-12-09 NOTE — Telephone Encounter (Signed)
Called patient and reviewed the following recommendations from MD  Cr elevated. Previously normal. Encourage adequate hydration with water. Recheck bmp in 10 days.   Patient stated that he did not want the combination blood pressure pill. He stated he spoke with the pharmacist and they told him that is what is making his Cr Elevated.

## 2022-12-09 NOTE — Progress Notes (Signed)
Spoke with patient. Stop olmesartan-hydrochlorothiazide. Sending rx for olmesartan 40 mg. Also sending amlodipine 10 mg. Advised to watch for leg swelling as common SE.

## 2022-12-10 NOTE — Telephone Encounter (Signed)
Please call pt to schedule an appt for sleep and weight.  KP

## 2022-12-12 ENCOUNTER — Ambulatory Visit (INDEPENDENT_AMBULATORY_CARE_PROVIDER_SITE_OTHER): Payer: Medicare HMO | Admitting: Physician Assistant

## 2022-12-12 ENCOUNTER — Encounter: Payer: Self-pay | Admitting: Physician Assistant

## 2022-12-12 ENCOUNTER — Ambulatory Visit
Admission: RE | Admit: 2022-12-12 | Discharge: 2022-12-12 | Disposition: A | Discharge status: Home / Self Care | Payer: Medicare HMO | Attending: Physician Assistant | Admitting: Physician Assistant

## 2022-12-12 ENCOUNTER — Other Ambulatory Visit: Payer: Self-pay | Admitting: Physician Assistant

## 2022-12-12 ENCOUNTER — Ambulatory Visit
Admission: RE | Admit: 2022-12-12 | Discharge: 2022-12-12 | Disposition: A | Discharge status: Home / Self Care | Payer: Medicare HMO | Source: Ambulatory Visit | Attending: Physician Assistant | Admitting: Physician Assistant

## 2022-12-12 ENCOUNTER — Telehealth: Payer: Self-pay | Admitting: Physician Assistant

## 2022-12-12 VITALS — BP 154/90 | HR 82 | Temp 97.9°F | Ht 66.0 in | Wt 192.0 lb

## 2022-12-12 DIAGNOSIS — R140 Abdominal distension (gaseous): Secondary | ICD-10-CM | POA: Insufficient documentation

## 2022-12-12 DIAGNOSIS — J42 Unspecified chronic bronchitis: Secondary | ICD-10-CM | POA: Diagnosis not present

## 2022-12-12 DIAGNOSIS — G4700 Insomnia, unspecified: Secondary | ICD-10-CM

## 2022-12-12 DIAGNOSIS — G8929 Other chronic pain: Secondary | ICD-10-CM

## 2022-12-12 DIAGNOSIS — N200 Calculus of kidney: Secondary | ICD-10-CM | POA: Diagnosis not present

## 2022-12-12 DIAGNOSIS — I10 Essential (primary) hypertension: Secondary | ICD-10-CM | POA: Diagnosis not present

## 2022-12-12 DIAGNOSIS — R635 Abnormal weight gain: Secondary | ICD-10-CM | POA: Diagnosis not present

## 2022-12-12 DIAGNOSIS — M25551 Pain in right hip: Secondary | ICD-10-CM | POA: Diagnosis not present

## 2022-12-12 DIAGNOSIS — N2889 Other specified disorders of kidney and ureter: Secondary | ICD-10-CM | POA: Diagnosis not present

## 2022-12-12 MED ORDER — LIDOCAINE 4 % EX PTCH: 1.0000 | MEDICATED_PATCH | CUTANEOUS | 2 refills | Status: DC

## 2022-12-12 MED ORDER — ALBUTEROL SULFATE HFA 108 (90 BASE) MCG/ACT IN AERS
2.0000 | INHALATION_SPRAY | Freq: Four times a day (QID) | RESPIRATORY_TRACT | 11 refills | Status: DC | PRN
Start: 2022-12-12 — End: 2023-07-04

## 2022-12-12 MED ORDER — HYDROXYZINE HCL 25 MG PO TABS
25.0000 mg | ORAL_TABLET | Freq: Every evening | ORAL | 1 refills | Status: DC | PRN
Start: 2022-12-12 — End: 2023-01-09

## 2022-12-12 NOTE — Progress Notes (Signed)
Date:  12/12/2022   Name:  Riley Anderson   DOB:  [birth year]   MRN:  [id number]   Chief Complaint: Insomnia and Weight Check (Wants to loose weight /)  Insomnia Primary symptoms: fragmented sleep, difficulty falling asleep, frequent awakening, malaise/fatigue, napping.   Episode onset: X6 months. The problem occurs nightly. The problem is unchanged. How many beverages per day that contain caffeine: 0 - 1.  Types of beverages you drink: coffee. Nothing relieves the symptoms. Past treatments include nothing. Typical bedtime:  10-11 P.M..  How long after going to bed to you fall asleep: other (3-4 hours).     Cannan presents today for 24-month follow-up on chronic conditions namely HTN, tobacco use, osteoarthritis.  Last visit 08/21/2022 we completed preoperative clearance anticipating left partial knee replacement with Dr. Marilynn Rail.  They were unable to proceed with surgery due to patient's report of distant chest pain in years past which the Ortho team felt necessitated cardiac workup.  Patient established with Dr. Azucena Cecil and just had coronary CTA which was clear.  Last time he also told me he would be seeking second opinion from another urologist because Dr. Lonna Cobb wanted to perform prostate biopsy but patient refused.  A few days ago he called the clinic requesting a change in medication due to concerns for olmesartan-HCTZ negatively affecting the kidney, so I represcribed as regular olmesartan and wrote a new prescription for amlodipine which he is taking with good compliance, no SE, but still HTN remains inadequately controlled at home.   Still smoking though he has reduced down to about 6-7 cigarettes per day.  Reports smoker's cough, wheezing, occasionally short of breath with exertion or at night when trying to sleep.  He has never used any inhalers though he likely has COPD.  He did not complete LDCT ordered last visit, says no one called him.  Endorses insomnia, feels like  he has trouble settling at night and turning off his brain.  Also experiences a "tickle" in the back of the throat and asks for cough syrup with codeine.  Often takes a swig of liquor before bed to help with the tickle and get him to sleep.  Complains of weight gain, chart review shows increase of about 20 lb in the last 3-4 months. Perhaps related, he endorses bloating and increased flatulence without diarrhea or constipation.  The only significant medication change that I can tell was a gradual increase of atorvastatin dose.     Medication list has been reviewed and updated.  Current Meds  Medication Sig   albuterol (VENTOLIN HFA) 108 (90 Base) MCG/ACT inhaler Inhale 2 puffs into the lungs every 6 (six) hours as needed for wheezing or shortness of breath.   amLODipine (NORVASC) 10 MG tablet Take 1 tablet (10 mg total) by mouth daily.   atorvastatin (LIPITOR) 40 MG tablet Take 1 tablet (40 mg total) by mouth daily.   hydrOXYzine (ATARAX) 25 MG tablet Take 1 tablet (25 mg total) by mouth at bedtime as needed.   lidocaine 4 % Place 1 patch onto the skin daily.   olmesartan (BENICAR) 40 MG tablet Take 1 tablet (40 mg total) by mouth daily.     Review of Systems  Constitutional:  Positive for malaise/fatigue and unexpected weight change. Negative for fatigue and fever.  Respiratory:  Positive for cough, shortness of breath and wheezing. Negative for chest tightness.   Cardiovascular:  Negative for chest pain, palpitations and leg swelling.  Gastrointestinal:  Negative  for abdominal pain.  Psychiatric/Behavioral:  The patient has insomnia.     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/24/2022   Osteoarthritis, multiple sites 07/24/2022   Elevated PSA 06/18/2022    GAD (generalized anxiety disorder) 06/18/2022   Primary hypertension 05/13/2022   Tobacco use disorder 05/13/2022   Chronic bronchitis (HCC) 05/13/2022   Overweight 05/13/2022    Allergies  Allergen Reactions   Other Anaphylaxis    Iron medication that starts with an "A"   Sudafed [Pseudoephedrine Hcl]     Unknown   Percocet [Oxycodone-Acetaminophen] Rash    Immunization History  Administered Date(s) Administered   Tdap 04/07/2016    Past Surgical History:  Procedure Laterality Date   DENTAL SURGERY     I & D EXTREMITY Right 04/07/2016   Procedure: IRRIGATION AND DEBRIDEMENT RIGHT INDEX FINGER WITH REPAIRS AND RECONSTRUCTION AS NECESSARY;  Surgeon: Dairl Ponder, MD;  Location: [facility] OR;  Service: Orthopedics;  Laterality: Right;    Social History   Tobacco Use   Smoking status: Every Day    Current packs/day: 0.30    Average packs/day: 1.5 packs/day for 50.3 years (75.1 ttl pk-yrs)    Types: Cigarettes    Start date: 76    Last attempt to quit: 2024   Smokeless tobacco: Never  Vaping Use   Vaping status: Never Used  Substance Use Topics   Alcohol use: Yes   Drug use: No    Family History  Problem Relation Age of Onset   Arthritis Mother    Heart disease Father    Hypertension Father    COPD Father    Cancer Father    Melanoma Father    Diabetes Sister    Cancer Sister    Thyroid cancer Brother         12/12/2022    1:28 PM 08/21/2022    1:20 PM 06/18/2022    9:29 AM  GAD 7 : Generalized Anxiety Score  Nervous, Anxious, on Edge 0 0 0  Control/stop worrying 0 0 0  Worry too much - different things 0 0 0  Trouble relaxing 0 0 0  Restless 0 0 0  Easily annoyed or irritable 0 0 2  Afraid - awful might happen 0 0 3  Total GAD 7 Score 0 0 5  Anxiety Difficulty Not difficult at all Not difficult at all Somewhat difficult       12/12/2022    1:28 PM 08/21/2022    1:19 PM 06/18/2022    9:29 AM  Depression screen PHQ 2/9  Decreased Interest 1 1 0   Down, Depressed, Hopeless 0 0 0  PHQ - 2 Score 1 1 0  Altered sleeping 0 0 3  Tired, decreased energy 0 0 0  Change in appetite 0 0 0  Feeling bad or failure about yourself  0 0 0  Trouble concentrating 0 0 0  Moving slowly or fidgety/restless 0 0 0  Suicidal thoughts 0 0 0  PHQ-9 Score 1 1 3   Difficult doing work/chores Not difficult at all Not difficult at all Not difficult at all    BP Readings from Last 3 Encounters:  12/12/22 (!) 154/90  12/05/22 (!) 135/91  12/02/22 (!) 144/92    Wt Readings from  Last 3 Encounters:  12/12/22 192 lb (87.1 kg)  12/02/22 189 lb 3.2 oz (85.8 kg)  08/21/22 173 lb (78.5 kg)    BP (!) 154/90 (BP Location: Left Arm, Patient Position: Sitting, Cuff Size: Large)   Pulse 82   Temp 97.9 F (36.6 C) (Oral)   Ht 5\' 6"  (1.676 m)   Wt 192 lb (87.1 kg)   SpO2 95%   BMI 30.99 kg/m   Physical Exam Vitals and nursing note reviewed.  Constitutional:      Appearance: Normal appearance.  Cardiovascular:     Rate and Rhythm: Normal rate and regular rhythm.     Heart sounds: No murmur heard.    No friction rub. No gallop.  Pulmonary:     Effort: Pulmonary effort is normal.     Breath sounds: No decreased air movement. Wheezing present. No rhonchi or rales.  Abdominal:     General: Abdomen is protuberant. There is distension.     Tenderness: There is no abdominal tenderness.     Comments: Abdomen is distended, very round, and quite firm. Difficult to palpate liver even with significant pressure. No tenderness elicited.   Musculoskeletal:        General: Normal range of motion.  Skin:    General: Skin is warm and dry.  Neurological:     Mental Status: He is alert and oriented to person, place, and time.     Gait: Gait is intact.  Psychiatric:        Mood and Affect: Mood and affect normal.     Recent Labs     Component Value Date/Time   NA 143 12/02/2022 1241   K 4.4 12/02/2022 1241   CL 106 12/02/2022 1241   CO2 22 12/02/2022 1241    GLUCOSE 145 (H) 12/02/2022 1241   GLUCOSE 99 06/18/2022 1012   BUN 26 12/02/2022 1241   CREATININE 1.56 (H) 12/02/2022 1241   CALCIUM 9.3 12/02/2022 1241   PROT 6.9 05/13/2022 0838   ALBUMIN 4.4 05/13/2022 0838   AST 13 05/13/2022 0838   ALT 13 05/13/2022 0838   ALKPHOS 84 05/13/2022 0838   BILITOT 0.3 05/13/2022 0838   GFRNONAA >60 04/07/2016 0600   GFRAA >60 04/07/2016 0600    Lab Results  Component Value Date   WBC 7.0 05/13/2022   HGB 14.5 05/13/2022   HCT 43.1 05/13/2022   MCV 91.6 05/13/2022   PLT 310.0 05/13/2022   Lab Results  Component Value Date   HGBA1C 6.4 05/13/2022   Lab Results  Component Value Date   CHOL 192 05/13/2022   HDL 41.60 05/13/2022   LDLCALC 117 (H) 05/13/2022   TRIG 166.0 (H) 05/13/2022   CHOLHDL 5 05/13/2022   Lab Results  Component Value Date   TSH 2.20 05/13/2022     Assessment and Plan:  1. Insomnia, unspecified type Try hydroxyzine at night which may have added benefit for sleep, anxiety, and that the "tickle" in the back of the throat.  Discussed with patient I cannot give him cough syrup with codeine for long-term use, and we will reserve this for acute exacerbations of his bronchitis. - hydrOXYzine (ATARAX) 25 MG tablet; Take 1 tablet (25 mg total) by mouth at bedtime as needed.  Dispense: 30 tablet; Refill: 1  2. Weight gain 20 lb unintentional weight gain over the last 3-4 months with a distended abdomen.  Discussed with patient I wonder if this may be fluid retention/ascites.  Discouraged nocturnal alcohol consumption.  Plan  for abdominal x-ray today +/- RUQ u/s  3. Abdominal distention 20 lb unintentional weight gain over the last 3-4 months with a distended abdomen.  Discussed with patient I wonder if this may be fluid retention/ascites.  Discouraged nocturnal alcohol consumption.  Plan for abdominal x-ray today +/- RUQ u/s - DG Abd 2 Views  4. Primary hypertension Inadequately controlled and labile.  Since we have only  recently made a medication change with amlodipine, I want him to continue the current regimen with home blood pressure monitoring.  Plan to comanage with cardiology.  Cardioselective beta-blocker may be a reasonable next step  5. Chronic bronchitis, unspecified chronic bronchitis type (HCC) Try albuterol for as needed relief of wheezing and dyspnea.  Encouraged smoking cessation.  If symptoms not well-controlled, plan for Flovent or similar at our next visit. - albuterol (VENTOLIN HFA) 108 (90 Base) MCG/ACT inhaler; Inhale 2 puffs into the lungs every 6 (six) hours as needed for wheezing or shortness of breath.  Dispense: 8 g; Refill: 11  6. Chronic pain of right hip Patient has for lidocaine patch.  I am not sure how much relief it we will give him considering the osteoarthritic etiology of his pain, but I will send a prescription today for him to try. - lidocaine 4 %; Place 1 patch onto the skin daily.  Dispense: 10 patch; Refill: 2   Return in about 4 weeks (around 01/09/2023) for OV f/u chronic conditions.    Alvester Morin, PA-C, DMSc, Nutritionist [facility] [phone]

## 2022-12-12 NOTE — Telephone Encounter (Signed)
Call unsuccessful, mailbox full. Patient needs to empty his voicemail. Abdominal XR difficult to read but I think he may be constipated even though he does not realize it. Radiologist interpretation will probably take 1-2 weeks but in the meantime I want him to use an OTC laxative of his choice (Miralax, Dulcolax, etc). Stop the laxative once diarrhea occurs. I also placed an order for RUQ ultrasound to check the liver. If able to speak with him, please clarify alcohol intake (# drinks/week including beer, wine, liquor).

## 2022-12-12 NOTE — Telephone Encounter (Signed)
Pt made aware and verbalized understanding.     Riley Odea, MD  Margrett Rud, [place] hours ago (5:05 PM)    Agree with stopping HCTZ.  Continue Norvasc, Benicar as prescribed.  Monitor BP at home and keep log.

## 2022-12-12 NOTE — Telephone Encounter (Signed)
Please call pt with results.   Abdominal XR difficult to read but I think he may be constipated even though he does not realize it. Radiologist interpretation will probably take 1-2 weeks but in the meantime I want him to use an OTC laxative of his choice (Miralax, Dulcolax, etc). Stop the laxative once diarrhea occurs. I also placed an order for RUQ ultrasound to check the liver. If able to speak with him, please clarify alcohol intake (# drinks/week including beer, wine, liquor).   KP

## 2022-12-12 NOTE — Telephone Encounter (Signed)
Pt is calling for the results of DG Abd 2 Views (Accession [id number]) (Order [id number]) . Please advise CB [phone]

## 2022-12-12 NOTE — Telephone Encounter (Signed)
Please review.  KP

## 2022-12-13 NOTE — Telephone Encounter (Signed)
Please review.  KP

## 2022-12-13 NOTE — Telephone Encounter (Signed)
Called and spoke with patient about x-ray results. Patient verbal understanding. Patient states that his skin is itchy, but has no other concerns. Patient also states that he dose not drink alcohol. Please advise patient about itchy skin.

## 2022-12-16 ENCOUNTER — Other Ambulatory Visit: Payer: Self-pay | Admitting: Physician Assistant

## 2022-12-16 ENCOUNTER — Encounter: Payer: Self-pay | Admitting: Physician Assistant

## 2022-12-16 ENCOUNTER — Ambulatory Visit (INDEPENDENT_AMBULATORY_CARE_PROVIDER_SITE_OTHER): Payer: Medicare HMO | Admitting: Physician Assistant

## 2022-12-16 DIAGNOSIS — F17210 Nicotine dependence, cigarettes, uncomplicated: Secondary | ICD-10-CM | POA: Diagnosis not present

## 2022-12-16 DIAGNOSIS — E782 Mixed hyperlipidemia: Secondary | ICD-10-CM

## 2022-12-16 DIAGNOSIS — F419 Anxiety disorder, unspecified: Secondary | ICD-10-CM

## 2022-12-16 MED ORDER — SERTRALINE HCL 50 MG PO TABS: 50.0000 mg | ORAL_TABLET | Freq: Every day | ORAL | 1 refills | Status: DC

## 2022-12-16 MED ORDER — ATORVASTATIN CALCIUM 40 MG PO TABS
20.0000 mg | ORAL_TABLET | Freq: Every day | ORAL | Status: DC
Start: 2022-12-16 — End: 2023-01-09

## 2022-12-16 NOTE — Patient Instructions (Signed)

## 2022-12-16 NOTE — Progress Notes (Signed)
Virtual Visit via Telephone Note  I connected with Warren Lacy on 12/16/22 at  9:45 AM  by telephone and verified that I am speaking with the correct person using two identifiers.  Location: Patient: home Provider: working virtually from home   I discussed the limitations, risks, security and privacy concerns of performing an evaluation and management service by telephone and the availability of in person appointments. I also discussed with the patient that there may be a patient responsible charge related to this service. The patient expressed understanding and agreed to proceed.     Shared Decision Making Visit Lung Cancer Screening Program [phone])   Eligibility: Age 32 Pack Years Smoking History Calculation 19 (# packs/per year x # years smoked) Recent History of coughing up blood  No Unexplained weight loss? No ( >Than 15 pounds within the last 6 months ) Prior History Lung / other cancer No (Diagnosis within the last 5 years already requiring surveillance chest CT Scans). Smoking Status Current Smoker  Visit Components: Discussion included one or more decision making aids. Yes Discussion included risk/benefits of screening. Yes Discussion included potential follow up diagnostic testing for abnormal scans. Yes Discussion included meaning and risk of over diagnosis. Yes Discussion included meaning and risk of False Positives. Yes Discussion included meaning of total radiation exposure. Yes  Counseling Included: Importance of adherence to annual lung cancer LDCT screening. Yes Impact of comorbidities on ability to participate in the program. Yes Ability and willingness to under diagnostic treatment: Yes  Smoking Cessation Counseling: Current Smokers:  Discussed importance of smoking cessation. Yes Information about tobacco cessation classes and interventions provided to patient. Yes Symptomatic Patient. No Diagnosis Code: Tobacco Use Z72.0 Asymptomatic Patient  Yes  Counseling (Intermediate counseling: > three minutes counseling) [id number] Information about tobacco cessation classes and interventions provided to patient. Yes Written Order for Lung Cancer Screening with LDCT placed in Epic. Yes (CT Chest Lung Cancer Screening Low Dose W/O CM) [id number] Z12.2-Screening of respiratory organs Z87.891-Personal history of nicotine dependence   I have spent 25 minutes of face to face/ virtual visit  time with the patient discussing the risks and benefits of lung cancer screening. We discussed the above noted topics. We paused at intervals to allow for questions to be asked and answered to ensure understanding.We discussed that the single most powerful action that anyone can take to decrease their risk of developing lung cancer is to quit smoking.  We discussed options for tools to aid in quitting smoking including nicotine replacement therapy, non-nicotine medications, support groups, Quit Smart classes, and behavior modification. We discussed that often times setting smaller, more achievable goals, such as eliminating 1 cigarette a day for a week and then 2 cigarettes a day for a week can be helpful in slowly decreasing the number of cigarettes smoked. I provided  them  with smoking cessation  information  with contact information for community resources, classes, free nicotine replacement therapy, and access to mobile apps, text messaging, and on-line smoking cessation help. I have also provided  them  the office contact information in the event they have any questions. We discussed the time and location of the scan, and that either Abigail Miyamoto RN, Karlton Lemon, RN  or I will call / send a letter with the results within 24-72 hours of receiving them. The patient verbalized understanding of all of  the above and had no further questions upon leaving the office. They have my contact information in the event they have any  further questions.  I spent 3 minutes counseling  on smoking cessation and the health risks of continued tobacco abuse.  I explained to the patient that there has been a high incidence of coronary artery disease noted on these exams. I explained that this is a non-gated exam therefore degree or severity cannot be determined. This patient is on statin therapy. I have asked the patient to follow-up with their PCP regarding any incidental finding of coronary artery disease and management with diet or medication as their PCP  feels is clinically indicated. The patient verbalized understanding of the above and had no further questions upon completion of the visit.    Darcella Gasman Girtrude Enslin, PA-C

## 2022-12-17 ENCOUNTER — Other Ambulatory Visit: Payer: Self-pay | Admitting: *Deleted

## 2022-12-17 ENCOUNTER — Ambulatory Visit
Admission: RE | Admit: 2022-12-17 | Discharge: 2022-12-17 | Disposition: A | Discharge status: Home / Self Care | Payer: Medicare HMO | Source: Ambulatory Visit | Attending: Acute Care | Admitting: Acute Care

## 2022-12-17 DIAGNOSIS — F17210 Nicotine dependence, cigarettes, uncomplicated: Secondary | ICD-10-CM | POA: Insufficient documentation

## 2022-12-17 DIAGNOSIS — Z87891 Personal history of nicotine dependence: Secondary | ICD-10-CM | POA: Insufficient documentation

## 2022-12-17 DIAGNOSIS — Z122 Encounter for screening for malignant neoplasm of respiratory organs: Secondary | ICD-10-CM | POA: Insufficient documentation

## 2022-12-18 ENCOUNTER — Other Ambulatory Visit: Payer: Self-pay | Admitting: Physician Assistant

## 2022-12-18 DIAGNOSIS — R140 Abdominal distension (gaseous): Secondary | ICD-10-CM

## 2022-12-18 DIAGNOSIS — R635 Abnormal weight gain: Secondary | ICD-10-CM

## 2022-12-19 ENCOUNTER — Ambulatory Visit: Payer: Medicare HMO

## 2022-12-19 ENCOUNTER — Ambulatory Visit: Admission: RE | Admit: 2022-12-19 | Payer: Medicare HMO | Source: Ambulatory Visit

## 2023-01-01 ENCOUNTER — Other Ambulatory Visit: Payer: Self-pay | Admitting: Physician Assistant

## 2023-01-01 DIAGNOSIS — I10 Essential (primary) hypertension: Secondary | ICD-10-CM

## 2023-01-06 ENCOUNTER — Other Ambulatory Visit: Payer: Self-pay | Admitting: Physician Assistant

## 2023-01-06 DIAGNOSIS — G4700 Insomnia, unspecified: Secondary | ICD-10-CM

## 2023-01-08 ENCOUNTER — Other Ambulatory Visit: Payer: Self-pay | Admitting: Physician Assistant

## 2023-01-09 ENCOUNTER — Encounter: Payer: Self-pay | Admitting: Physician Assistant

## 2023-01-09 ENCOUNTER — Ambulatory Visit (INDEPENDENT_AMBULATORY_CARE_PROVIDER_SITE_OTHER): Payer: Medicare HMO | Admitting: Physician Assistant

## 2023-01-09 VITALS — BP 110/84 | HR 91 | Ht 66.0 in | Wt 186.0 lb

## 2023-01-09 DIAGNOSIS — R972 Elevated prostate specific antigen [PSA]: Secondary | ICD-10-CM | POA: Diagnosis not present

## 2023-01-09 DIAGNOSIS — N2889 Other specified disorders of kidney and ureter: Secondary | ICD-10-CM

## 2023-01-09 DIAGNOSIS — I700 Atherosclerosis of aorta: Secondary | ICD-10-CM

## 2023-01-09 DIAGNOSIS — K7689 Other specified diseases of liver: Secondary | ICD-10-CM | POA: Diagnosis not present

## 2023-01-09 DIAGNOSIS — F419 Anxiety disorder, unspecified: Secondary | ICD-10-CM

## 2023-01-09 DIAGNOSIS — Q6102 Congenital multiple renal cysts: Secondary | ICD-10-CM

## 2023-01-09 DIAGNOSIS — R739 Hyperglycemia, unspecified: Secondary | ICD-10-CM

## 2023-01-09 DIAGNOSIS — F17200 Nicotine dependence, unspecified, uncomplicated: Secondary | ICD-10-CM

## 2023-01-09 DIAGNOSIS — I10 Essential (primary) hypertension: Secondary | ICD-10-CM | POA: Diagnosis not present

## 2023-01-09 DIAGNOSIS — E782 Mixed hyperlipidemia: Secondary | ICD-10-CM | POA: Diagnosis not present

## 2023-01-09 DIAGNOSIS — J439 Emphysema, unspecified: Secondary | ICD-10-CM

## 2023-01-09 DIAGNOSIS — Z1159 Encounter for screening for other viral diseases: Secondary | ICD-10-CM

## 2023-01-09 MED ORDER — VENLAFAXINE HCL ER 37.5 MG PO CP24
37.5000 mg | ORAL_CAPSULE | Freq: Every day | ORAL | 2 refills | Status: DC
Start: 2023-01-09 — End: 2023-02-03

## 2023-01-09 MED ORDER — PRAVASTATIN SODIUM 20 MG PO TABS
20.0000 mg | ORAL_TABLET | Freq: Every day | ORAL | 2 refills | Status: DC
Start: 2023-01-09 — End: 2023-03-13

## 2023-01-09 NOTE — Progress Notes (Addendum)
Date:  01/09/2023   Name:  Riley Anderson   DOB:  1955-08-13   MRN:  [id number]   Chief Complaint: Medication Reaction (Zoloft- works good cause diarrhea, still taking it - atorvastatin- joint pain and swelling ) and Insomnia  HPI Yochanan presents for 70-month f/u on chronic conditions.  Joined by his daughter today.  Last visit I had raised concern regarding subjective and objective abdominal swelling with weight gain which has since improved since patient self discontinued atorvastatin. He swears it was medication side effect and states "I'm better off without cholesterol medication. I had ordered abdominal ultrasound but it was not scheduled due to difficulty reaching patient.   Thankfully he was able to get his LDCT done which showed emphysema and aortic atherosclerosis.  However, it also detected an indeterminate right renal lesion, a large hepatic cyst, and at least 1 other renal cyst; suggested to follow-up with contrast-enhanced CT or MRI.  Anxiety. Asked me to prescribe sertraline shortly after last visit, which has helped anxiety and sleep but patient reports diarrhea as undesired side effect. He has stopped hydroxyzine.   Has been unable to reach [facility] regarding his left leg/knee. [phone] [place], Dr. Odis Luster. Last note from [facility] in June 2024 suggested they would be proceeding with left TKA.   Medication list has been reviewed and updated.  Current Meds  Medication Sig   albuterol (VENTOLIN HFA) 108 (90 Base) MCG/ACT inhaler Inhale 2 puffs into the lungs every 6 (six) hours as needed for wheezing or shortness of breath.   amLODipine (NORVASC) 10 MG tablet TAKE 1 TABLET BY MOUTH EVERY DAY   lidocaine 4 % Place 1 patch onto the skin daily.   olmesartan (BENICAR) 40 MG tablet Take 1 tablet (40 mg total) by mouth daily.   pravastatin (PRAVACHOL) 20 MG tablet Take 1 tablet (20 mg total) by mouth daily.   venlafaxine XR (EFFEXOR XR) 37.5 MG 24 hr capsule Take 1  capsule (37.5 mg total) by mouth daily with breakfast.   [DISCONTINUED] atorvastatin (LIPITOR) 40 MG tablet Take 0.5 tablets (20 mg total) by mouth daily.   [DISCONTINUED] hydrOXYzine (ATARAX) 25 MG tablet Take 1 tablet (25 mg total) by mouth at bedtime as needed.   [DISCONTINUED] sertraline (ZOLOFT) 50 MG tablet Take 1 tablet (50 mg total) by mouth daily.     Review of Systems  Constitutional:  Negative for fatigue and fever.  Respiratory:  Negative for chest tightness and shortness of breath.   Cardiovascular:  Negative for chest pain and palpitations.  Gastrointestinal:  Negative for abdominal pain.  Musculoskeletal:  Positive for arthralgias and gait problem.    Patient Active Problem List   Diagnosis Date Noted   Aortic atherosclerosis (HCC) 01/09/2023   Multiple renal cysts 01/09/2023   Hepatic cyst 01/09/2023   Anxiety 01/09/2023   Chronic pain of right hip 12/12/2022   Mixed hyperlipidemia 08/21/2022   Low back pain radiating to left lower extremity 07/24/2022   Degenerative arthritis of lumbar spine 07/24/2022   Spondylosis of lumbar spine 07/24/2022   Osteoarthritis, multiple sites 07/24/2022   Elevated PSA 06/18/2022   GAD (generalized anxiety disorder) 06/18/2022   Primary hypertension 05/13/2022   Tobacco use disorder 05/13/2022   Emphysema/COPD (HCC) 05/13/2022   Overweight 05/13/2022    Allergies  Allergen Reactions   Other Anaphylaxis    Iron medication that starts with an "A"   Sudafed [Pseudoephedrine Hcl]     Unknown   Percocet [Oxycodone-Acetaminophen] Rash  Immunization History  Administered Date(s) Administered   Tdap 04/07/2016    Past Surgical History:  Procedure Laterality Date   DENTAL SURGERY     I & D EXTREMITY Right 04/07/2016   Procedure: IRRIGATION AND DEBRIDEMENT RIGHT INDEX FINGER WITH REPAIRS AND RECONSTRUCTION AS NECESSARY;  Surgeon: Dairl Ponder, MD;  Location: [facility] OR;  Service: Orthopedics;  Laterality: Right;    Social  History   Tobacco Use   Smoking status: Every Day    Current packs/day: 0.30    Average packs/day: 1 pack/day for 50.4 years (50.1 ttl pk-yrs)    Types: Cigarettes    Start date: 37    Last attempt to quit: 2024   Smokeless tobacco: Never  Vaping Use   Vaping status: Never Used  Substance Use Topics   Alcohol use: Yes   Drug use: No    Family History  Problem Relation Age of Onset   Arthritis Mother    Heart disease Father    Hypertension Father    COPD Father    Cancer Father    Melanoma Father    Diabetes Sister    Cancer Sister    Thyroid cancer Brother         01/09/2023    3:32 PM 12/12/2022    1:28 PM 08/21/2022    1:20 PM 06/18/2022    9:29 AM  GAD 7 : Generalized Anxiety Score  Nervous, Anxious, on Edge 1 0 0 0  Control/stop worrying 0 0 0 0  Worry too much - different things 0 0 0 0  Trouble relaxing 1 0 0 0  Restless 0 0 0 0  Easily annoyed or irritable 0 0 0 2  Afraid - awful might happen 0 0 0 3  Total GAD 7 Score 2 0 0 5  Anxiety Difficulty Not difficult at all Not difficult at all Not difficult at all Somewhat difficult       01/09/2023    3:31 PM 12/12/2022    1:28 PM 08/21/2022    1:19 PM  Depression screen PHQ 2/9  Decreased Interest 0 1 1  Down, Depressed, Hopeless 0 0 0  PHQ - 2 Score 0 1 1  Altered sleeping 2 0 0  Tired, decreased energy 1 0 0  Change in appetite 0 0 0  Feeling bad or failure about yourself  0 0 0  Trouble concentrating 0 0 0  Moving slowly or fidgety/restless 0 0 0  Suicidal thoughts 0 0 0  PHQ-9 Score 3 1 1   Difficult doing work/chores Not difficult at all Not difficult at all Not difficult at all    BP Readings from Last 3 Encounters:  01/09/23 110/84  12/12/22 (!) 154/90  12/05/22 (!) 135/91    Wt Readings from Last 3 Encounters:  01/09/23 186 lb (84.4 kg)  12/12/22 192 lb (87.1 kg)  12/02/22 189 lb 3.2 oz (85.8 kg)    BP 110/84   Pulse 91   Ht 5\' 6"  (1.676 m)   Wt 186 lb (84.4 kg)   SpO2 96%    BMI 30.02 kg/m   Physical Exam Vitals and nursing note reviewed.  Constitutional:      Appearance: Normal appearance.  Cardiovascular:     Rate and Rhythm: Normal rate and regular rhythm.     Heart sounds: No murmur heard.    No friction rub. No gallop.  Pulmonary:     Effort: Pulmonary effort is normal.     Breath sounds: No  decreased air movement. No wheezing, rhonchi or rales.  Abdominal:     General: Abdomen is protuberant. There is no distension.     Palpations: Abdomen is soft.     Tenderness: There is no abdominal tenderness.     Comments: Abdomen is protuberant, but not distended like previous.  Difficult to palpate liver even with significant pressure. No tenderness elicited.  Negative for hepatojugular reflux  Musculoskeletal:        General: Normal range of motion.  Skin:    General: Skin is warm and dry.  Neurological:     Mental Status: He is alert and oriented to person, place, and time.     Gait: Gait is intact.  Psychiatric:        Mood and Affect: Mood and affect normal.    Recent Labs     Component Value Date/Time   NA 143 12/02/2022 1241   K 4.4 12/02/2022 1241   CL 106 12/02/2022 1241   CO2 22 12/02/2022 1241   GLUCOSE 145 (H) 12/02/2022 1241   GLUCOSE 99 06/18/2022 1012   BUN 26 12/02/2022 1241   CREATININE 1.56 (H) 12/02/2022 1241   CALCIUM 9.3 12/02/2022 1241   PROT 6.9 05/13/2022 0838   ALBUMIN 4.4 05/13/2022 0838   AST 13 05/13/2022 0838   ALT 13 05/13/2022 0838   ALKPHOS 84 05/13/2022 0838   BILITOT 0.3 05/13/2022 0838   GFRNONAA >60 04/07/2016 0600   GFRAA >60 04/07/2016 0600    Lab Results  Component Value Date   WBC 7.0 05/13/2022   HGB 14.5 05/13/2022   HCT 43.1 05/13/2022   MCV 91.6 05/13/2022   PLT 310.0 05/13/2022   Lab Results  Component Value Date   HGBA1C 6.4 05/13/2022   Lab Results  Component Value Date   CHOL 192 05/13/2022   HDL 41.60 05/13/2022   LDLCALC 117 (H) 05/13/2022   TRIG 166.0 (H) 05/13/2022    CHOLHDL 5 05/13/2022   Lab Results  Component Value Date   TSH 2.20 05/13/2022     Assessment and Plan:  1. Primary hypertension Seems well-controlled today.  Continue current regimen.  I have called [facility] for an update on plans for left THA, they will call me back when able.  May be using a short course of Lasix to see how this affects abdominal swelling and weight.  Will hold off on prescribing this medication until CMP results.  - Comprehensive metabolic panel - Lipid panel  2. Mixed hyperlipidemia Check nonfasting lipids today.  Reviewed the importance of statin therapy to stabilize plaques within the arteries, which we have seen on imaging, in addition to cholesterol control.  Patient is agreeable to low-dose pravastatin which will replace atorvastatin. - Lipid panel - pravastatin (PRAVACHOL) 20 MG tablet; Take 1 tablet (20 mg total) by mouth daily.  Dispense: 30 tablet; Refill: 2  3. Tobacco use disorder Encouraged cessation - Lipid panel  4. Pulmonary emphysema, unspecified emphysema type (HCC) Reviewed with patient, encouraged smoking cessation.  Otherwise continue current pulmonary regimen.  5. Aortic atherosclerosis (HCC) Plan as above - Lipid panel - pravastatin (PRAVACHOL) 20 MG tablet; Take 1 tablet (20 mg total) by mouth daily.  Dispense: 30 tablet; Refill: 2  6. Multiple renal cysts Reviewed plan with patient to proceed with MRI of abdomen given renal and hepatic cysts, indeterminate renal lesion, and prostate lesion.  Patient in agreement if cost is reasonable.  Otherwise we will plan for CT of abdomen/pelvis with contrast. - MR  Abdomen W Wo Contrast  7. Hepatic cyst Reviewed plan with patient to proceed with MRI of abdomen given renal and hepatic cysts, indeterminate renal lesion, and prostate lesion.  Patient in agreement if cost is reasonable.  Otherwise we will plan for CT of abdomen/pelvis with contrast. - MR Abdomen W Wo Contrast  8. Other  specified disorders of kidney and ureter Reviewed plan with patient to proceed with MRI of abdomen given renal and hepatic cysts, indeterminate renal lesion, and prostate lesion.  Patient in agreement if cost is reasonable.  Otherwise we will plan for CT of abdomen/pelvis with contrast. - MR Abdomen W Wo Contrast  9. Anxiety Stop sertraline, begin low-dose venlafaxine once daily - venlafaxine XR (EFFEXOR XR) 37.5 MG 24 hr capsule; Take 1 capsule (37.5 mg total) by mouth daily with breakfast.  Dispense: 30 capsule; Refill: 2  10. Hyperglycemia Check A1c.  Last A1c was 6.4%. - Hemoglobin A1c  11. Need for hepatitis C screening test - Hepatitis C antibody  12. Elevated PSA - PSA   Patient made aware of multiple new medications today.  Advised not to begin all at the same time to avoid side effect confusion.  Return in about 6 weeks (around 02/20/2023) for OV f/u chronic conditions.   Today's visit billed for estimated provider time of 80 minutes spent addressing multiple acute and chronic complaints, personally reviewing recent imaging, discussing treatment plan with patient and daughter, prescribing multiple new medications, reviewing specialist notes.  Alvester Morin, PA-C, DMSc, Nutritionist [facility] [phone]

## 2023-01-10 ENCOUNTER — Other Ambulatory Visit: Payer: Self-pay | Admitting: Physician Assistant

## 2023-01-10 ENCOUNTER — Encounter: Payer: Self-pay | Admitting: Physician Assistant

## 2023-01-10 ENCOUNTER — Telehealth: Payer: Self-pay | Admitting: Acute Care

## 2023-01-10 DIAGNOSIS — E1129 Type 2 diabetes mellitus with other diabetic kidney complication: Secondary | ICD-10-CM | POA: Insufficient documentation

## 2023-01-10 DIAGNOSIS — Z122 Encounter for screening for malignant neoplasm of respiratory organs: Secondary | ICD-10-CM

## 2023-01-10 DIAGNOSIS — R1900 Intra-abdominal and pelvic swelling, mass and lump, unspecified site: Secondary | ICD-10-CM

## 2023-01-10 DIAGNOSIS — F17210 Nicotine dependence, cigarettes, uncomplicated: Secondary | ICD-10-CM

## 2023-01-10 DIAGNOSIS — Z87891 Personal history of nicotine dependence: Secondary | ICD-10-CM

## 2023-01-10 DIAGNOSIS — E119 Type 2 diabetes mellitus without complications: Secondary | ICD-10-CM | POA: Insufficient documentation

## 2023-01-10 LAB — COMPREHENSIVE METABOLIC PANEL
ALT: 21 [IU]/L (ref 0–44)
AST: 15 [IU]/L (ref 0–40)
Albumin: 4.6 g/dL (ref 3.9–4.9)
Alkaline Phosphatase: 124 [IU]/L — ABNORMAL HIGH (ref 44–121)
BUN/Creatinine Ratio: 14 (ref 10–24)
BUN: 23 mg/dL (ref 8–27)
Bilirubin Total: 0.2 mg/dL (ref 0.0–1.2)
CO2: 21 mmol/L (ref 20–29)
Calcium: 10.1 mg/dL (ref 8.6–10.2)
Chloride: 106 mmol/L (ref 96–106)
Creatinine, Ser: 1.6 mg/dL — ABNORMAL HIGH (ref 0.76–1.27)
Globulin, Total: 2.6 g/dL (ref 1.5–4.5)
Glucose: 83 mg/dL (ref 70–99)
Potassium: 4.6 mmol/L (ref 3.5–5.2)
Sodium: 143 mmol/L (ref 134–144)
Total Protein: 7.2 g/dL (ref 6.0–8.5)
eGFR: 47 mL/min/{1.73_m2} — ABNORMAL LOW (ref >59)

## 2023-01-10 LAB — LIPID PANEL
Chol/HDL Ratio: 6.2 ratio — ABNORMAL HIGH (ref 0.0–5.0)
Cholesterol, Total: 235 mg/dL — ABNORMAL HIGH (ref 100–199)
HDL: 38 mg/dL — ABNORMAL LOW (ref >39)
LDL Chol Calc (NIH): 143 mg/dL — ABNORMAL HIGH (ref 0–99)
Triglycerides: 294 mg/dL — ABNORMAL HIGH (ref 0–149)
VLDL Cholesterol Cal: 54 mg/dL — ABNORMAL HIGH (ref 5–40)

## 2023-01-10 LAB — HEMOGLOBIN A1C
Est. average glucose Bld gHb Est-mCnc: 146 mg/dL
Hgb A1c MFr Bld: 6.7 % — ABNORMAL HIGH (ref 4.8–5.6)

## 2023-01-10 LAB — HEPATITIS C ANTIBODY: Hep C Virus Ab: NONREACTIVE

## 2023-01-10 LAB — PSA: Prostate Specific Ag, Serum: 10.8 ng/mL — ABNORMAL HIGH (ref 0.0–4.0)

## 2023-01-10 MED ORDER — FUROSEMIDE 20 MG PO TABS
20.0000 mg | ORAL_TABLET | Freq: Every day | ORAL | 0 refills | Status: DC
Start: 2023-01-10 — End: 2023-02-24

## 2023-01-10 NOTE — Telephone Encounter (Addendum)
I have attempted to call the patient with the results of their  Low Dose CT Chest Lung cancer screening scan. There was no answer. I have left a HIPPA compliant VM requesting the patient call the office for the scan results. I included the office contact information in the message. We will await his return call. If no return call we will continue to call until patient is contacted.    Lung cancer screening was not concerning for lung cancer. Annual screening scan 11/2023 There was notation of a Partially visualized Hyperattenuating lesion of the interpolar region of the right kidney measuring 2.8 cm, possibly a proteinaceous or hemorrhagic cyst.Radiology recommended follow up imaging, renal protocol.   PA Waddell , I see that  you have ordered an MR abdomen with and without. Thanks so much for  ordering.   Jonita Albee and [place], please order annual screening scan for 11/2023. Also.please send a letter to the patient with results. His voice mail is full and I was unable to leave a message. Thanks so much

## 2023-01-13 NOTE — Telephone Encounter (Signed)
Annual LDCT 2025 order has been placed

## 2023-01-14 ENCOUNTER — Telehealth: Payer: Self-pay | Admitting: Physician Assistant

## 2023-01-14 NOTE — Telephone Encounter (Signed)
Spoke with Morrie Sheldon regarding update on status of left knee replacement. I was able to speak with Dr. Odis Luster the ortho surgeon, and they have tried to reach [place] many times to schedule. I also called [place] this afternoon and was unable to leave voicemail (full).   I relayed this info to [place] who will help to clear the voicemail. I gave her the phone number to call and schedule the surgery, which is [phone] Ext [phone]. I have advised her to call between 8:30a-4p tomorrow 01/15/23. She will likely speak to [facility], Dr. Odis Luster' CMA.

## 2023-01-14 NOTE — Telephone Encounter (Signed)
Copied from CRM [id number]. Topic: Medicare AWV >> Jan 14, 2023  1:52 PM Payton Doughty wrote: Reason for [id number] LVM 01/14/2023 to schedule Annual Wellness Visit  Verlee Rossetti; Care Guide Ambulatory Clinical Support [place] [facility] Direct Dial: [phone]

## 2023-01-16 ENCOUNTER — Ambulatory Visit
Admission: RE | Admit: 2023-01-16 | Discharge: 2023-01-16 | Disposition: A | Discharge status: Home / Self Care | Payer: Medicare HMO | Source: Ambulatory Visit | Attending: Physician Assistant | Admitting: Physician Assistant

## 2023-01-16 ENCOUNTER — Ambulatory Visit: Payer: Medicare HMO | Attending: Cardiology | Admitting: Cardiology

## 2023-01-16 DIAGNOSIS — N281 Cyst of kidney, acquired: Secondary | ICD-10-CM | POA: Diagnosis not present

## 2023-01-16 DIAGNOSIS — K7689 Other specified diseases of liver: Secondary | ICD-10-CM | POA: Diagnosis not present

## 2023-01-16 DIAGNOSIS — N2889 Other specified disorders of kidney and ureter: Secondary | ICD-10-CM | POA: Diagnosis not present

## 2023-01-16 DIAGNOSIS — I700 Atherosclerosis of aorta: Secondary | ICD-10-CM | POA: Diagnosis not present

## 2023-01-16 DIAGNOSIS — Q6102 Congenital multiple renal cysts: Secondary | ICD-10-CM | POA: Insufficient documentation

## 2023-01-16 DIAGNOSIS — D3501 Benign neoplasm of right adrenal gland: Secondary | ICD-10-CM | POA: Diagnosis not present

## 2023-01-16 MED ORDER — GADOBUTROL 1 MMOL/ML IV SOLN
7.5000 mL | Freq: Once | INTRAVENOUS | Status: AC | PRN
  Administered 2023-01-16: 7.5 mL via INTRAVENOUS

## 2023-01-17 ENCOUNTER — Other Ambulatory Visit: Payer: Self-pay | Admitting: Physician Assistant

## 2023-01-17 ENCOUNTER — Encounter: Payer: Self-pay | Admitting: Cardiology

## 2023-01-17 ENCOUNTER — Telehealth: Payer: Self-pay | Admitting: Physician Assistant

## 2023-01-17 NOTE — Telephone Encounter (Signed)
Copied from CRM [id number]. Topic: Referral - Status >> Jan 17, 2023 10:44 AM Macon Large wrote: Reason for CRM: Pt reports that he does want a referral for Urology to be sent [facility] in [place].

## 2023-01-22 DIAGNOSIS — Z01818 Encounter for other preprocedural examination: Secondary | ICD-10-CM | POA: Diagnosis not present

## 2023-01-22 DIAGNOSIS — M1712 Unilateral primary osteoarthritis, left knee: Secondary | ICD-10-CM | POA: Diagnosis not present

## 2023-01-28 ENCOUNTER — Encounter: Payer: Self-pay | Admitting: Emergency Medicine

## 2023-01-30 ENCOUNTER — Telehealth: Payer: Self-pay | Admitting: Physician Assistant

## 2023-01-30 ENCOUNTER — Ambulatory Visit: Payer: Medicare HMO

## 2023-01-30 VITALS — Ht 66.0 in | Wt 185.0 lb

## 2023-01-30 DIAGNOSIS — Z0000 Encounter for general adult medical examination without abnormal findings: Secondary | ICD-10-CM | POA: Diagnosis not present

## 2023-01-30 NOTE — Progress Notes (Signed)
Subjective:   Riley Anderson is a 67 y.o. male who presents for an Initial Medicare Annual Wellness Visit.  Visit Complete: Virtual I connected with  Riley Anderson on 01/30/23 by a audio enabled telemedicine application and verified that I am speaking with the correct person using two identifiers.  Patient Location: Home  Provider Location: Home Office  I discussed the limitations of evaluation and management by telemedicine. The patient expressed understanding and agreed to proceed.  Vital Signs: Because this visit was a virtual/telehealth visit, some criteria may be missing or patient reported. Any vitals not documented were not able to be obtained and vitals that have been documented are patient reported.   Cardiac Risk Factors include: advanced age (>24men, >28 women);male gender;diabetes mellitus;dyslipidemia;hypertension;smoking/ tobacco exposure     Objective:    Today's Vitals   01/30/23 1245  Weight: 185 lb (83.9 kg)  Height: 5\' 6"  (1.676 m)  PainSc: 9    Body mass index is 29.86 kg/m.     01/30/2023    1:04 PM 03/23/2022    2:05 PM 04/07/2016    8:54 AM 04/07/2016    5:41 AM  Advanced Directives  Does Patient Have a Medical Advance Directive? No No No No  Would patient like information on creating a medical advance directive? Yes (MAU/Ambulatory/Procedural Areas - Information given)   Yes (ED - Information included in AVS)    Current Medications (verified) Outpatient Encounter Medications as of 01/30/2023  Medication Sig   albuterol (VENTOLIN HFA) 108 (90 Base) MCG/ACT inhaler Inhale 2 puffs into the lungs every 6 (six) hours as needed for wheezing or shortness of breath.   amLODipine (NORVASC) 10 MG tablet TAKE 1 TABLET BY MOUTH EVERY DAY   olmesartan (BENICAR) 40 MG tablet Take 1 tablet (40 mg total) by mouth daily.   pravastatin (PRAVACHOL) 20 MG tablet Take 1 tablet (20 mg total) by mouth daily.   venlafaxine XR (EFFEXOR XR) 37.5 MG 24 hr capsule Take 1 capsule  (37.5 mg total) by mouth daily with breakfast.   furosemide (LASIX) 20 MG tablet Take 1 tablet (20 mg total) by mouth daily. (Patient not taking: Reported on 01/30/2023)   lidocaine 4 % Place 1 patch onto the skin daily. (Patient not taking: Reported on 01/30/2023)   No facility-administered encounter medications on file as of 01/30/2023.    Allergies (verified) Other, Sudafed [pseudoephedrine hcl], and Percocet [oxycodone-acetaminophen]   History: Past Medical History:  Diagnosis Date   Arthritis    Chronic bronchitis (HCC)    Hypertension    Past Surgical History:  Procedure Laterality Date   DENTAL SURGERY     I & D EXTREMITY Right 04/07/2016   Procedure: IRRIGATION AND DEBRIDEMENT RIGHT INDEX FINGER WITH REPAIRS AND RECONSTRUCTION AS NECESSARY;  Surgeon: Dairl Ponder, MD;  Location: [facility] OR;  Service: Orthopedics;  Laterality: Right;   Family History  Problem Relation Age of Onset   Arthritis Mother    Dementia Mother    Heart disease Father    Hypertension Father    COPD Father    Cancer Father    Melanoma Father    Diabetes Sister    Cancer Sister    Thyroid cancer Brother    Social History   Socioeconomic History   Marital status: Legally Separated    Spouse name: Riley Anderson   Number of children: 2   Years of education: Not on file   Highest education level: 11th grade  Occupational History   Occupation:  retired  Tobacco Use   Smoking status: Former    Current packs/day: 0.00    Average packs/day: 1 pack/day for 50.4 years (50.1 ttl pk-yrs)    Types: Cigarettes    Start date: 89    Quit date: 01/20/2023    Years since quitting: 0.0   Smokeless tobacco: Never   Tobacco comments:    Quit smoking ~ 01/20/23 prior to getting knee replacement surgery  Vaping Use   Vaping status: Never Used  Substance and Sexual Activity   Alcohol use: Not Currently   Drug use: No   Sexual activity: Not Currently  Other Topics Concern   Not on file  Social History  Narrative   [birth year]   36      Retired, works on the side Teaching laboratory technician   Social Determinants of Corporate investment banker Strain: Low Risk  (01/30/2023)   Overall Financial Resource Strain (CARDIA)    Difficulty of Paying Living Expenses: Not hard at all  Food Insecurity: No Food Insecurity (01/30/2023)   Hunger Vital Sign    Worried About Running Out of Food in the Last Year: Never true    Ran Out of Food in the Last Year: Never true  Transportation Needs: No Transportation Needs (01/30/2023)   PRAPARE - Administrator, Civil Service (Medical): No    Lack of Transportation (Non-Medical): No  Physical Activity: Insufficiently Active (01/30/2023)   Exercise Vital Sign    Days of Exercise per Week: 7 days    Minutes of Exercise per Session: 10 min  Stress: No Stress Concern Present (01/30/2023)   [employer] of Occupational Health - Occupational Stress Questionnaire    Feeling of Stress : Not at all  Social Connections: Socially Isolated (01/30/2023)   Social Connection and Isolation Panel [NHANES]    Frequency of Communication with Friends and Family: More than three times a week    Frequency of Social Gatherings with Friends and Family: More than three times a week    Attends Religious Services: Never    Database administrator or Organizations: No    Attends Banker Meetings: Never    Marital Status: Separated    Tobacco Counseling Counseling given: Not Answered Tobacco comments: Quit smoking ~ 01/20/23 prior to getting knee replacement surgery   Clinical Intake:  Pre-visit preparation completed: Yes  Pain : 0-10 Pain Score: 9  Pain Type: Chronic pain Pain Location: Hip Pain Orientation: Right Pain Descriptors / Indicators: Aching     BMI - recorded: 29.86 Nutritional Status: BMI 25 -29 Overweight Nutritional Risks: None Diabetes: Yes CBG done?: No Did pt. bring in CBG monitor from home?: No  How often do you need to have  someone help you when you read instructions, pamphlets, or other written materials from your doctor or pharmacy?: 3 - Sometimes (daughter helps if needed) What is the last grade level you completed in school?: 11th  Interpreter Needed?: No  Information entered by :: Tora Kindred, CMA   Activities of Daily Living    01/30/2023   12:49 PM  In your present state of health, do you have any difficulty performing the following activities:  Hearing? 0  Vision? 0  Difficulty concentrating or making decisions? 0  Walking or climbing stairs? 1  Comment chronic hip and knee pain  Dressing or bathing? 0  Doing errands, shopping? 0  Preparing Food and eating ? N  Using the Toilet? N  In the past  six months, have you accidently leaked urine? N  Do you have problems with loss of bowel control? N  Managing your Medications? N  Managing your Finances? N  Housekeeping or managing your Housekeeping? N    Patient Care Team: Remo Lipps, PA as PCP - General (Physician Assistant) Debbe Odea, MD as PCP - Cardiology (Cardiology)  Indicate any recent Medical Services you may have received from other than [facility] providers in the past year (date may be approximate).     Assessment:   This is a routine wellness examination for Jasmond.  Hearing/Vision screen Hearing Screening - Comments:: Denies hearing loss Vision Screening - Comments:: Gets eye exams   Goals Addressed               This Visit's Progress     Patient Stated (pt-stated)        Continue to not smoke      Depression Screen    01/30/2023    1:02 PM 01/09/2023    3:31 PM 12/12/2022    1:28 PM 08/21/2022    1:19 PM 06/18/2022    9:29 AM  PHQ 2/9 Scores  PHQ - 2 Score 0 0 1 1 0  PHQ- 9 Score 0 3 1 1 3     Fall Risk    01/30/2023    1:09 PM 01/09/2023    3:32 PM 12/12/2022    1:28 PM 08/21/2022    1:20 PM 06/18/2022    9:29 AM  Fall Risk   Falls in the past year? 0 0 0 0 0  Number falls in past yr: 0 0 0 0  0  Injury with Fall? 0 0 0 0 0  Risk for fall due to : No Fall Risks No Fall Risks No Fall Risks No Fall Risks No Fall Risks  Follow up Falls prevention discussed Falls evaluation completed Falls evaluation completed Falls evaluation completed Falls evaluation completed    MEDICARE RISK AT HOME: Medicare Risk at Home Any stairs in or around the home?: Yes If so, are there any without handrails?: No Home free of loose throw rugs in walkways, pet beds, electrical cords, etc?: Yes Adequate lighting in your home to reduce risk of falls?: Yes Life alert?: No Use of a cane, walker or w/c?: No Grab bars in the bathroom?: Yes Shower chair or bench in shower?: No Elevated toilet seat or a handicapped toilet?: No  TIMED UP AND GO:  Was the test performed? No    Cognitive Function:        01/30/2023    1:11 PM  6CIT Screen  What Year? 0 points  What month? 0 points  What time? 0 points  Count back from 20 0 points  Months in reverse 0 points  Repeat phrase 0 points  Total Score 0 points    Immunizations Immunization History  Administered Date(s) Administered   Tdap 04/07/2016    TDAP status: Up to date  Flu Vaccine status: Declined, Education has been provided regarding the importance of this vaccine but patient still declined. Advised may receive this vaccine at local pharmacy or Health Dept. Aware to provide a copy of the vaccination record if obtained from local pharmacy or Health Dept. Verbalized acceptance and understanding.  Pneumococcal vaccine status: Declined,  Education has been provided regarding the importance of this vaccine but patient still declined. Advised may receive this vaccine at local pharmacy or Health Dept. Aware to provide a copy of the vaccination record if obtained  from local pharmacy or Health Dept. Verbalized acceptance and understanding.   Covid-19 vaccine status: Declined, Education has been provided regarding the importance of this vaccine but  patient still declined. Advised may receive this vaccine at local pharmacy or Health Dept.or vaccine clinic. Aware to provide a copy of the vaccination record if obtained from local pharmacy or Health Dept. Verbalized acceptance and understanding.  Qualifies for Shingles Vaccine? Yes   Zostavax completed No   Shingrix Completed?: No.    Education has been provided regarding the importance of this vaccine. Patient has been advised to call insurance company to determine out of pocket expense if they have not yet received this vaccine. Advised may also receive vaccine at local pharmacy or Health Dept. Verbalized acceptance and understanding. Patient declined  Screening Tests Health Maintenance  Topic Date Due   COVID-19 Vaccine (1) Never done   Pneumonia Vaccine 36+ Years old (1 of 2 - PCV) Never done   FOOT EXAM  Never done   OPHTHALMOLOGY EXAM  Never done   Diabetic kidney evaluation - Urine ACR  Never done   Zoster Vaccines- Shingrix (1 of 2) Never done   INFLUENZA VACCINE  05/26/2023 (Originally 09/26/2022)   HEMOGLOBIN A1C  07/09/2023   Lung Cancer Screening  12/17/2023   Diabetic kidney evaluation - eGFR measurement  01/09/2024   Medicare Annual Wellness (AWV)  01/30/2024   Fecal DNA (Cologuard)  08/15/2025   DTaP/Tdap/Td (2 - Td or Tdap) 04/07/2026   Hepatitis C Screening  Completed   HPV VACCINES  Aged Out    Health Maintenance  Health Maintenance Due  Topic Date Due   COVID-19 Vaccine (1) Never done   Pneumonia Vaccine 56+ Years old (1 of 2 - PCV) Never done   FOOT EXAM  Never done   OPHTHALMOLOGY EXAM  Never done   Diabetic kidney evaluation - Urine ACR  Never done   Zoster Vaccines- Shingrix (1 of 2) Never done    Colorectal cancer screening: Type of screening: Cologuard. Completed 08/16/22. Repeat every 3 years  Lung Cancer Screening: (Low Dose CT Chest recommended if Age 70-80 years, 20 pack-year currently smoking OR have quit w/in 15years.) does qualify. Done  12/17/22  Lung Cancer Screening Referral: order placed for 2025 on 01/13/23  Additional Screening:  Hepatitis C Screening: does not qualify; Completed 01/09/23  Vision Screening: Recommended annual ophthalmology exams for early detection of glaucoma and other disorders of the eye. Is the patient up to date with their annual eye exam?  Yes , last exam 09/2022 per patient. Records requested. Who is the provider or what is the name of the office in which the patient attends annual eye exams [facility], [place]. Dr. Verne Carrow If pt is not established with a provider, would they like to be referred to a provider to establish care? No .   Dental Screening: Recommended annual dental exams for proper oral hygiene  Diabetic Foot Exam: Diabetic Foot Exam: Overdue, Pt has been advised about the importance in completing this exam. Pt is scheduled for diabetic foot exam on at next office visit.  Community Resource Referral / Chronic Care Management: CRR required this visit?  No   CCM required this visit?  No    Plan:     I have personally reviewed and noted the following in the patient's chart:   Medical and social history Use of alcohol, tobacco or illicit drugs  Current medications and supplements including opioid prescriptions. Patient is not currently  taking opioid prescriptions. Functional ability and status Nutritional status Physical activity Advanced directives List of other physicians Hospitalizations, surgeries, and ER visits in previous 12 months Vitals Screenings to include cognitive, depression, and falls Referrals and appointments  In addition, I have reviewed and discussed with patient certain preventive protocols, quality metrics, and best practice recommendations. A written personalized care plan for preventive services as well as general preventive health recommendations were provided to patient.     Tora Kindred, CMA   01/30/2023   After Visit Summary:  (MyChart) Due to this being a telephonic visit, the after visit summary with patients personalized plan was offered to patient via MyChart   Nurse Notes:  Declined referral to DM & Nutrition education. Needs DM foot exam at next OV. Declined flu, pneumonia and covid vaccines. Requested most recent eye exam from [facility] in [place], [place], Dr. Verne Carrow

## 2023-01-30 NOTE — Telephone Encounter (Signed)
Spoke with Riley Anderson to follow-up on our conversation from Tuesday and remind him again to schedule urology visit.  He does intend to return to [facility].  I told him to write a reminder to himself.  He has the office number for them.

## 2023-01-30 NOTE — Patient Instructions (Addendum)
Riley Anderson , Thank you for taking time to come for your Medicare Wellness Visit. I appreciate your ongoing commitment to your health goals. Please review the following plan we discussed and let me know if I can assist you in the future.   Referrals/Orders/Follow-Ups/Clinician Recommendations: You need a diabetic foot exam with Tillie Fantasia, PA at your next office visit. I have requested your most recent diabetic eye exam from Dr. Verne Carrow at [facility] in [place].   This is a list of the screening recommended for you and due dates:  Health Maintenance  Topic Date Due   COVID-19 Vaccine (1) Never done   Pneumonia Vaccine (1 of 2 - PCV) Never done   Complete foot exam   Never done   Eye exam for diabetics  Never done   Yearly kidney health urinalysis for diabetes  Never done   Zoster (Shingles) Vaccine (1 of 2) Never done   Flu Shot  05/26/2023*   Hemoglobin A1C  07/09/2023   Screening for Lung Cancer  12/17/2023   Yearly kidney function blood test for diabetes  01/09/2024   Medicare Annual Wellness Visit  01/30/2024   Cologuard (Stool DNA test)  08/15/2025   DTaP/Tdap/Td vaccine (2 - Td or Tdap) 04/07/2026   Hepatitis C Screening  Completed   HPV Vaccine  Aged Out  *Topic was postponed. The date shown is not the original due date.    Advanced directives: (ACP Link)Information on Advanced Care Planning can be found at [facility] of [place] Advance Health Care Directives Advance Health Care Directives ([email or web address]   Once you have completed the forms, please bring a copy of your health care power of attorney and living will to the office to be added to your chart at your convenience.   Next Medicare Annual Wellness Visit scheduled for next year: Yes, 03/04/24 @ 12:30pm

## 2023-02-01 ENCOUNTER — Other Ambulatory Visit: Payer: Self-pay | Admitting: Physician Assistant

## 2023-02-01 DIAGNOSIS — F419 Anxiety disorder, unspecified: Secondary | ICD-10-CM

## 2023-02-06 DIAGNOSIS — M1712 Unilateral primary osteoarthritis, left knee: Secondary | ICD-10-CM | POA: Diagnosis not present

## 2023-02-10 DIAGNOSIS — Z0189 Encounter for other specified special examinations: Secondary | ICD-10-CM | POA: Diagnosis not present

## 2023-02-10 DIAGNOSIS — M1611 Unilateral primary osteoarthritis, right hip: Secondary | ICD-10-CM | POA: Diagnosis not present

## 2023-02-14 ENCOUNTER — Ambulatory Visit: Payer: Self-pay | Admitting: *Deleted

## 2023-02-14 NOTE — Telephone Encounter (Signed)
Patient's daughter is calling with concerns:  Chief Complaint: increased anxiety- patient stopped new rx: venlafaxine XR (EFFEXOR-XR) 37.5 MG 24 hr capsule- only took for 2 days   Patient has stopped smoking due to upcoming surgery and daughter states patient is stress eating. Patient is not caring for himself-  hygiene and activity has decreased. All this is causing conflict with family/living situation  Frequency: worse since last visit Pertinent Negatives: Patient denies suicidal ideation  Disposition: [] ED /[] Urgent Care (no appt availability in office) / [x] Appointment(In office/virtual)/ []  [facility]/ [] Home Care/ [] Refused Recommended Disposition /[] [facility]/ []  Follow-up with PCP Additional Notes: Patient's daughter is concerned about patient status- his anxiety is worse. Appointment scheduled   Reason for Disposition  MODERATE anxiety (e.g., persistent or frequent anxiety symptoms; interferes with sleep, school, or work)  Answer Assessment - Initial Assessment Questions 1. CONCERN: "Did anything happen that prompted you to call today?"      Patient is having anxiety - recent change in medication and upcoming surgery- increased eating, patient stopped smoking 2. ANXIETY SYMPTOMS: "Can you describe how you (your loved one; patient) have been feeling?" (e.g., tense, restless, panicky, anxious, keyed up, overwhelmed, sense of impending doom).      Increased eating since stopped smoking 3. ONSET: "How long have you been feeling this way?" (e.g., hours, days, weeks)     Patient's daughter states patient is not motivated, barely bathing, not cleaning well after bowel movement 4. SEVERITY: "How would you rate the level of anxiety?" (e.g., 0 - 10; or mild, moderate, severe).     Moderate/severe 5. FUNCTIONAL IMPAIRMENT: "How have these feelings affected your ability to do daily activities?" "Have you had more difficulty than usual doing your normal daily activities?"  (e.g., getting better, same, worse; self-care, school, work, interactions)     Not care for himself- cleaning, eating candy 6. HISTORY: "Have you felt this way before?" "Have you ever been diagnosed with an anxiety problem in the past?" (e.g., generalized anxiety disorder, panic attacks, PTSD). If Yes, ask: "How was this problem treated?" (e.g., medicines, counseling, etc.)     Recent change in medication-patient only took 2 days- due to diarrhea 7. RISK OF HARM - SUICIDAL IDEATION: "Do you ever have thoughts of hurting or killing yourself?" If Yes, ask:  "Do you have these feelings now?" "Do you have a plan on how you would do this?"     Patient has not talked about suicidal ideation 8. TREATMENT:  "What has been done so far to treat this anxiety?" (e.g., medicines, relaxation strategies). "What has helped?"     Stopped venlafaxine XR (EFFEXOR-XR) 37.5 MG 24 hr capsule due to SE  11. PATIENT SUPPORT: "Who is with you now?" "Who do you live with?" "Do you have family or friends who you can talk to?"        Daughter is calling with concerns- he lives with her Patient sleeps during the day- up throughout the night  Protocols used: Anxiety and Panic Attack-A-AH

## 2023-02-17 ENCOUNTER — Ambulatory Visit: Payer: Medicare HMO | Admitting: Physician Assistant

## 2023-02-24 ENCOUNTER — Telehealth: Payer: Self-pay | Admitting: Urology

## 2023-02-24 ENCOUNTER — Ambulatory Visit (INDEPENDENT_AMBULATORY_CARE_PROVIDER_SITE_OTHER): Payer: Medicare HMO | Admitting: Physician Assistant

## 2023-02-24 ENCOUNTER — Encounter: Payer: Self-pay | Admitting: Physician Assistant

## 2023-02-24 VITALS — BP 142/92 | HR 95 | Temp 97.9°F | Ht 66.0 in | Wt 194.0 lb

## 2023-02-24 DIAGNOSIS — E119 Type 2 diabetes mellitus without complications: Secondary | ICD-10-CM | POA: Diagnosis not present

## 2023-02-24 DIAGNOSIS — R8290 Unspecified abnormal findings in urine: Secondary | ICD-10-CM | POA: Diagnosis not present

## 2023-02-24 DIAGNOSIS — R972 Elevated prostate specific antigen [PSA]: Secondary | ICD-10-CM

## 2023-02-24 DIAGNOSIS — R809 Proteinuria, unspecified: Secondary | ICD-10-CM

## 2023-02-24 DIAGNOSIS — F419 Anxiety disorder, unspecified: Secondary | ICD-10-CM | POA: Diagnosis not present

## 2023-02-24 LAB — POCT URINALYSIS DIPSTICK
Bilirubin, UA: NEGATIVE
Blood, UA: NEGATIVE
Glucose, UA: NEGATIVE
Ketones, UA: NEGATIVE
Leukocytes, UA: NEGATIVE
Nitrite, UA: NEGATIVE
Protein, UA: POSITIVE — AB
Spec Grav, UA: >=1.03 — AB (ref 1.010–1.025)
Urobilinogen, UA: 0.2 U/dL
pH, UA: 5 (ref 5.0–8.0)

## 2023-02-24 MED ORDER — VENLAFAXINE HCL ER 37.5 MG PO CP24: 37.5000 mg | ORAL_CAPSULE | Freq: Every evening | ORAL | Status: DC

## 2023-02-24 NOTE — Progress Notes (Signed)
Date:  02/24/2023   Name:  Riley Anderson   DOB:  [birth year]   MRN:  [id number]   Chief Complaint: malodorous urine  Urinary Tract Infection  This is a new problem. Episode onset: X4-5 weeks. The problem occurs every urination. The patient is experiencing no pain. There has been no fever. He is Not sexually active. There is A history of pyelonephritis. He has tried nothing for the symptoms.   Riley Anderson presents today for evaluation of foul-smelling urine for about a month now.  Upcoming orthopedic surgery in January, until then patient states his activity is limited due to pain and difficulty with gait.  As a result, he is gaining a significant amount of weight (up 10 lb in the last month, 20 lbs in the last 6 months) and his diet is poor specially when it comes to sweets.  New diagnosis of diabetes with A1c 6.7% last visit in November.  Patient states he drinks a lot of juice and chocolate milk, eats candy.  He endorses drinking about 4 bottles of water per day but I question the veracity of this statement.  He is having preop labs drawn next Tues.   Still has not made appt with [facility] for probable BPH with PSA >10,  despite 4-5 reminders in various forms. Gets up 4-5 times per night to pee. He is convinced he has "no problem with peeing."   Stopped venlafaxine after 2 days due to mild diarrhea and drowsiness.   Medication list has been reviewed and updated.  Current Meds  Medication Sig   albuterol (VENTOLIN HFA) 108 (90 Base) MCG/ACT inhaler Inhale 2 puffs into the lungs every 6 (six) hours as needed for wheezing or shortness of breath.   amLODipine (NORVASC) 10 MG tablet TAKE 1 TABLET BY MOUTH EVERY DAY   olmesartan (BENICAR) 40 MG tablet Take 1 tablet (40 mg total) by mouth daily.   pravastatin (PRAVACHOL) 20 MG tablet Take 1 tablet (20 mg total) by mouth daily.   [DISCONTINUED] furosemide (LASIX) 20 MG tablet Take 1 tablet (20 mg total) by mouth daily.     Review of  Systems  Patient Active Problem List   Diagnosis Date Noted   Diet-controlled type 2 diabetes mellitus (HCC) 01/10/2023   Aortic atherosclerosis (HCC) 01/09/2023   Multiple renal cysts 01/09/2023   Hepatic cyst 01/09/2023   Anxiety 01/09/2023   Chronic pain of right hip 12/12/2022   Closed fracture of distal end of radius 09/10/2022   Mixed hyperlipidemia 08/21/2022   Low back pain radiating to left lower extremity 07/24/2022   Degenerative arthritis of lumbar spine 07/24/2022   Spondylosis of lumbar spine 07/24/2022   Osteoarthritis, multiple sites 07/24/2022   Elevated PSA 06/18/2022   GAD (generalized anxiety disorder) 06/18/2022   Primary hypertension 05/13/2022   Tobacco use disorder 05/13/2022   Emphysema/COPD (HCC) 05/13/2022   Overweight 05/13/2022    Allergies  Allergen Reactions   Other Anaphylaxis    Iron medication that starts with an "A"   Sudafed [Pseudoephedrine Hcl]     Unknown   Percocet [Oxycodone-Acetaminophen] Rash    Immunization History  Administered Date(s) Administered   Tdap 04/07/2016    Past Surgical History:  Procedure Laterality Date   DENTAL SURGERY     I & D EXTREMITY Right 04/07/2016   Procedure: IRRIGATION AND DEBRIDEMENT RIGHT INDEX FINGER WITH REPAIRS AND RECONSTRUCTION AS NECESSARY;  Surgeon: Dairl Ponder, MD;  Location: [facility] OR;  Service: Orthopedics;  Laterality: Right;  Social History   Tobacco Use   Smoking status: Former    Current packs/day: 0.00    Average packs/day: 1 pack/day for 50.4 years (50.1 ttl pk-yrs)    Types: Cigarettes    Start date: 98    Quit date: 01/20/2023    Years since quitting: 0.0   Smokeless tobacco: Never   Tobacco comments:    Quit smoking ~ 01/20/23 prior to getting knee replacement surgery  Vaping Use   Vaping status: Never Used  Substance Use Topics   Alcohol use: Not Currently   Drug use: No    Family History  Problem Relation Age of Onset   Arthritis Mother    Dementia  Mother    Heart disease Father    Hypertension Father    COPD Father    Cancer Father    Melanoma Father    Diabetes Sister    Cancer Sister    Thyroid cancer Brother         02/24/2023   10:02 AM 01/09/2023    3:32 PM 12/12/2022    1:28 PM 08/21/2022    1:20 PM  GAD 7 : Generalized Anxiety Score  Nervous, Anxious, on Edge 1 1 0 0  Control/stop worrying 0 0 0 0  Worry too much - different things 0 0 0 0  Trouble relaxing 1 1 0 0  Restless 0 0 0 0  Easily annoyed or irritable 0 0 0 0  Afraid - awful might happen 0 0 0 0  Total GAD 7 Score 2 2 0 0  Anxiety Difficulty Not difficult at all Not difficult at all Not difficult at all Not difficult at all       02/24/2023   10:02 AM 01/30/2023    1:02 PM 01/09/2023    3:31 PM  Depression screen PHQ 2/9  Decreased Interest 0 0 0  Down, Depressed, Hopeless 0 0 0  PHQ - 2 Score 0 0 0  Altered sleeping 0 0 2  Tired, decreased energy 0 0 1  Change in appetite 0 0 0  Feeling bad or failure about yourself  0 0 0  Trouble concentrating 0 0 0  Moving slowly or fidgety/restless 0 0 0  Suicidal thoughts 0 0 0  PHQ-9 Score 0 0 3  Difficult doing work/chores Not difficult at all Not difficult at all Not difficult at all    BP Readings from Last 3 Encounters:  02/24/23 (!) 142/92  01/09/23 110/84  12/12/22 (!) 154/90    Wt Readings from Last 3 Encounters:  02/24/23 194 lb (88 kg)  01/30/23 185 lb (83.9 kg)  01/09/23 186 lb (84.4 kg)    BP (!) 142/92 (BP Location: Left Arm, Patient Position: Sitting, Cuff Size: Normal)   Pulse 95   Temp 97.9 F (36.6 C) (Oral)   Ht 5\' 6"  (1.676 m)   Wt 194 lb (88 kg)   SpO2 96%   BMI 31.31 kg/m   Physical Exam Vitals and nursing note reviewed.  Constitutional:      Appearance: Normal appearance.  Cardiovascular:     Rate and Rhythm: Normal rate.     Heart sounds: Normal heart sounds. No murmur heard. Pulmonary:     Effort: Pulmonary effort is normal.     Breath sounds: Wheezing  present. No rhonchi or rales.  Abdominal:     General: Abdomen is protuberant. There is no distension.     Tenderness: There is no abdominal tenderness.  Musculoskeletal:  General: Normal range of motion.     Right lower leg: No edema.     Left lower leg: No edema.  Skin:    General: Skin is warm and dry.  Neurological:     Mental Status: He is alert and oriented to person, place, and time.     Gait: Gait is intact.  Psychiatric:        Mood and Affect: Mood and affect normal.      Recent Labs     Component Value Date/Time   NA 143 01/09/2023 1638   K 4.6 01/09/2023 1638   CL 106 01/09/2023 1638   CO2 21 01/09/2023 1638   GLUCOSE 83 01/09/2023 1638   GLUCOSE 99 06/18/2022 1012   BUN 23 01/09/2023 1638   CREATININE 1.60 (H) 01/09/2023 1638   CALCIUM 10.1 01/09/2023 1638   PROT 7.2 01/09/2023 1638   ALBUMIN 4.6 01/09/2023 1638   AST 15 01/09/2023 1638   ALT 21 01/09/2023 1638   ALKPHOS 124 (H) 01/09/2023 1638   BILITOT 0.2 01/09/2023 1638   GFRNONAA >60 04/07/2016 0600   GFRAA >60 04/07/2016 0600    Lab Results  Component Value Date   WBC 7.0 05/13/2022   HGB 14.5 05/13/2022   HCT 43.1 05/13/2022   MCV 91.6 05/13/2022   PLT 310.0 05/13/2022   Lab Results  Component Value Date   HGBA1C 6.7 (H) 01/09/2023   Lab Results  Component Value Date   CHOL 235 (H) 01/09/2023   HDL 38 (L) 01/09/2023   LDLCALC 143 (H) 01/09/2023   TRIG 294 (H) 01/09/2023   CHOLHDL 6.2 (H) 01/09/2023   Lab Results  Component Value Date   TSH 2.20 05/13/2022     Assessment and Plan:  1. Bad odor of urine (Primary) Probably from inadequate water intake.  Dipstick with proteinuria but no UTI.  Will send for micral considering new diabetes diagnosis.  Encouraged drinking more water throughout the day and less juice/chocolate milk.  Advised no more than 1-2 sugar sweetened beverages daily. - POCT urinalysis dipstick - Microalbumin / creatinine urine ratio  2. Proteinuria,  unspecified type Plan as above - Microalbumin / creatinine urine ratio  3. Diet-controlled type 2 diabetes mellitus (HCC) Emphasized the importance of nutritive diet in controlling diabetes, otherwise he may need pharmacotherapy in the near future (which he really wants to avoid).    Recommend diet low in simple carbohydrates such as white starches (bread, pasta, rice) and refined sugar found in desserts and sweetened beverages including juice, sweet tea, and soda.  Due for A1c recheck in February.  4. Anxiety Encourage patient to try this medication again, but take it at night instead of in the morning.  May have added benefit for improving sleep. - venlafaxine XR (EFFEXOR-XR) 37.5 MG 24 hr capsule; Take 1 capsule (37.5 mg total) by mouth every evening.  5. Elevated PSA Offered tamsulosin to help with urinary efficiency and allow for more complete voiding, especially leading up to bedtime.  Patient declines this medicine today despite clearly having nocturia.  Defer to urology - encouraged to make appointment with them before he leaves our parking lot today.   Return in about 2 months (around 04/25/2023) for OV f/u chronic conditions.    Alvester Morin, PA-C, DMSc, Nutritionist [facility] [phone]

## 2023-02-24 NOTE — Patient Instructions (Signed)
-  It was a pleasure to see you today! Please review your visit summary for helpful information -Lab results are usually available within 1-2 days and we will call once reviewed -I would encourage you to follow your care via MyChart where you can access lab results, notes, messages, and more -If you feel that we did a nice job today, please complete your after-visit survey and leave us a Google review! Your CMA today was Kieandra and your provider was Dan Waddell, PA-C, DMSc -Please return for follow-up in about 2 months  

## 2023-02-24 NOTE — Telephone Encounter (Signed)
error 

## 2023-02-25 LAB — MICROALBUMIN / CREATININE URINE RATIO
Creatinine, Urine: 154.7 mg/dL
Microalb/Creat Ratio: 103 mg/g{creat} — ABNORMAL HIGH (ref 0–29)
Microalbumin, Urine: 160.1 ug/mL

## 2023-02-27 ENCOUNTER — Encounter: Payer: Self-pay | Admitting: Physician Assistant

## 2023-02-27 DIAGNOSIS — R809 Proteinuria, unspecified: Secondary | ICD-10-CM | POA: Insufficient documentation

## 2023-02-27 DIAGNOSIS — R7989 Other specified abnormal findings of blood chemistry: Secondary | ICD-10-CM | POA: Insufficient documentation

## 2023-03-04 ENCOUNTER — Telehealth: Payer: Self-pay | Admitting: Physician Assistant

## 2023-03-04 NOTE — Telephone Encounter (Signed)
 Left voice mail to have patient call to set up appointment for Bp.

## 2023-03-04 NOTE — Telephone Encounter (Signed)
 Copied from CRM [id number]. Topic: General - Other >> Mar 03, 2023  5:04 PM Everette C wrote: Reason for CRM: The patient has called to share that their BP was 140/90 last night 03/01/22  The patient shares that their BP was 150/100 at the time of call with agent   Please contact the patient further if needed

## 2023-03-04 NOTE — Telephone Encounter (Signed)
 Appt?  KP

## 2023-03-04 NOTE — Telephone Encounter (Signed)
 Please call pt to schedule an appt for BP.  KP

## 2023-03-05 ENCOUNTER — Other Ambulatory Visit: Payer: Self-pay

## 2023-03-05 NOTE — Telephone Encounter (Signed)
 Patient called stated he had an appt with another provider and found out his device was giving false readings. His pressure yesterday was 130/80.

## 2023-03-13 ENCOUNTER — Ambulatory Visit: Payer: 59 | Admitting: Physician Assistant

## 2023-03-13 ENCOUNTER — Encounter: Payer: Self-pay | Admitting: Physician Assistant

## 2023-03-13 VITALS — BP 150/90 | HR 93 | Temp 98.1°F | Ht 66.0 in | Wt 194.0 lb

## 2023-03-13 DIAGNOSIS — Z91148 Patient's other noncompliance with medication regimen for other reason: Secondary | ICD-10-CM

## 2023-03-13 DIAGNOSIS — R809 Proteinuria, unspecified: Secondary | ICD-10-CM

## 2023-03-13 DIAGNOSIS — E1129 Type 2 diabetes mellitus with other diabetic kidney complication: Secondary | ICD-10-CM

## 2023-03-13 DIAGNOSIS — J439 Emphysema, unspecified: Secondary | ICD-10-CM

## 2023-03-13 DIAGNOSIS — I10 Essential (primary) hypertension: Secondary | ICD-10-CM | POA: Diagnosis not present

## 2023-03-13 DIAGNOSIS — I700 Atherosclerosis of aorta: Secondary | ICD-10-CM

## 2023-03-13 DIAGNOSIS — E782 Mixed hyperlipidemia: Secondary | ICD-10-CM | POA: Diagnosis not present

## 2023-03-13 MED ORDER — AMLODIPINE BESYLATE 10 MG PO TABS: 5.0000 mg | ORAL_TABLET | Freq: Every day | ORAL | Status: DC

## 2023-03-13 MED ORDER — PRAVASTATIN SODIUM 20 MG PO TABS: 10.0000 mg | ORAL_TABLET | Freq: Every day | ORAL | Status: DC

## 2023-03-13 NOTE — Assessment & Plan Note (Signed)
Remains abstinent from cigarette smoking, congratulated on this.  Oxygenating well on room air.  Continue albuterol as needed

## 2023-03-13 NOTE — Assessment & Plan Note (Signed)
Med list with instructions printed for patient today.  Emphasized the importance of compliance.  He is to let me know if he has any issues with his medications, so that I can prescribe reasonable alternatives

## 2023-03-13 NOTE — Progress Notes (Signed)
Date:  03/13/2023   Name:  Riley Anderson   DOB:  1955/09/21   MRN:  [id number]   Chief Complaint: Hypertension (Amlodipine makes him shaky )  HPI Riley Anderson presents for 2-week follow-up on chronic conditions, especially HTN.  We recently added amlodipine due to inadequate control of blood pressure in clinic and at home.  Unfortunately he stopped this medication claiming that it was causing "shaking".  Curiously, when asked if stopping the medication helped the shaking, he states he has not really noticed much difference.  He has not been monitoring blood pressure at home recently, says his wrist cuff was running high so he "did away with it".  He intends to buy an arm cuff from [place] today.  Never tried pravastatin which was prescribed in November.   Recently diagnosed with diabetes with an A1c of 6.7%, currently diet controlled, states he has made significant changes regarding the intake of carbs and sweets.  Thankfully he has finally made an appointment with urology, currently scheduled for 04/02/2023.  Planning left knee arthroplasty, but apparently his orthopedist was concerned for complications from anesthesia given his COPD and possible OSA.  For this reason he was referred to another orthopedic surgeon with [facility] and intends to consult with them in the near future.  He presents in a wheelchair for the first time today.   Medication list has been reviewed and updated.  Current Meds  Medication Sig   albuterol (VENTOLIN HFA) 108 (90 Base) MCG/ACT inhaler Inhale 2 puffs into the lungs every 6 (six) hours as needed for wheezing or shortness of breath.   olmesartan (BENICAR) 40 MG tablet Take 1 tablet (40 mg total) by mouth daily.   venlafaxine XR (EFFEXOR-XR) 37.5 MG 24 hr capsule Take 1 capsule (37.5 mg total) by mouth every evening.   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, without long-term current use of insulin (HCC) 01/10/2023   Aortic atherosclerosis (HCC) 01/09/2023   Multiple renal cysts 01/09/2023   Hepatic cyst 01/09/2023   Anxiety 01/09/2023   Chronic pain of right hip 12/12/2022   Closed fracture of distal end of radius 09/10/2022   Mixed hyperlipidemia 08/21/2022   Low back pain radiating to left lower extremity 07/24/2022   Degenerative arthritis of lumbar spine 07/24/2022   Spondylosis of lumbar spine 07/24/2022   Osteoarthritis, multiple sites 07/24/2022   Elevated PSA 06/18/2022   GAD (generalized anxiety disorder) 06/18/2022   Primary hypertension 05/13/2022   Tobacco use disorder 05/13/2022   Emphysema/COPD (HCC) 05/13/2022   Overweight 05/13/2022    Allergies  Allergen Reactions   Other Anaphylaxis    Iron medication that starts with an "A"   Pseudoephedrine Hcl Other (See Comments)    Unknown   Sudafed [Pseudoephedrine Hcl]     Unknown   Percocet [Oxycodone-Acetaminophen] Rash    Immunization History  Administered Date(s) Administered   Tdap 04/07/2016    Past Surgical History:  Procedure Laterality Date   DENTAL SURGERY     I & D EXTREMITY Right 04/07/2016   Procedure: IRRIGATION AND DEBRIDEMENT RIGHT INDEX FINGER WITH REPAIRS AND RECONSTRUCTION AS NECESSARY;  Surgeon: Dairl Ponder, MD;  Location: [facility] OR;  Service: Orthopedics;  Laterality:  Right;    Social History   Tobacco Use   Smoking status: Former    Current packs/day: 0.00    Average packs/day: 1 pack/day for 50.4 years (50.1 ttl pk-yrs)    Types: Cigarettes    Start date: 4    Quit date: 01/20/2023    Years since quitting: 0.1   Smokeless tobacco: Never   Tobacco comments:    Quit smoking ~ 01/20/23 prior to getting knee replacement surgery  Vaping Use    Vaping status: Never Used  Substance Use Topics   Alcohol use: Not Currently   Drug use: No    Family History  Problem Relation Age of Onset   Arthritis Mother    Dementia Mother    Heart disease Father    Hypertension Father    COPD Father    Cancer Father    Melanoma Father    Diabetes Sister    Cancer Sister    Thyroid cancer Brother         03/13/2023    1:22 PM 02/24/2023   10:02 AM 01/09/2023    3:32 PM 12/12/2022    1:28 PM  GAD 7 : Generalized Anxiety Score  Nervous, Anxious, on Edge 0 1 1 0  Control/stop worrying 0 0 0 0  Worry too much - different things 0 0 0 0  Trouble relaxing 0 1 1 0  Restless 0 0 0 0  Easily annoyed or irritable 0 0 0 0  Afraid - awful might happen 0 0 0 0  Total GAD 7 Score 0 2 2 0  Anxiety Difficulty Not difficult at all Not difficult at all Not difficult at all Not difficult at all       03/13/2023    1:22 PM 02/24/2023   10:02 AM 01/30/2023    1:02 PM  Depression screen PHQ 2/9  Decreased Interest 0 0 0  Down, Depressed, Hopeless 0 0 0  PHQ - 2 Score 0 0 0  Altered sleeping  0 0  Tired, decreased energy  0 0  Change in appetite  0 0  Feeling bad or failure about yourself   0 0  Trouble concentrating  0 0  Moving slowly or fidgety/restless  0 0  Suicidal thoughts  0 0  PHQ-9 Score  0 0  Difficult doing work/chores  Not difficult at all Not difficult at all    BP Readings from Last 3 Encounters:  03/13/23 (!) 150/90  02/24/23 (!) 142/92  01/09/23 110/84    Wt Readings from Last 3 Encounters:  03/13/23 194 lb (88 kg)  02/24/23 194 lb (88 kg)  01/30/23 185 lb (83.9 kg)    BP (!) 150/90 (BP Location: Left Arm, Patient Position: Sitting, Cuff Size: Normal)   Pulse 93   Temp 98.1 F (36.7 C)   Ht 5\' 6"  (1.676 m)   Wt 194 lb (88 kg)   SpO2 96%   BMI 31.31 kg/m   Physical Exam Vitals and nursing note reviewed.  Constitutional:      Appearance: Normal appearance.     Comments: Presents in wheelchair   Cardiovascular:     Rate and Rhythm: Normal rate.  Pulmonary:     Effort: Pulmonary effort is normal.  Abdominal:     General: There is no distension.  Musculoskeletal:        General: Normal range of motion.  Skin:    General: Skin is warm and dry.  Neurological:  Mental Status: He is alert and oriented to person, place, and time.     Gait: Gait is intact.  Psychiatric:        Mood and Affect: Mood and affect normal.     Recent Labs     Component Value Date/Time   NA 143 01/09/2023 1638   K 4.6 01/09/2023 1638   CL 106 01/09/2023 1638   CO2 21 01/09/2023 1638   GLUCOSE 83 01/09/2023 1638   GLUCOSE 99 06/18/2022 1012   BUN 23 01/09/2023 1638   CREATININE 1.60 (H) 01/09/2023 1638   CALCIUM 10.1 01/09/2023 1638   PROT 7.2 01/09/2023 1638   ALBUMIN 4.6 01/09/2023 1638   AST 15 01/09/2023 1638   ALT 21 01/09/2023 1638   ALKPHOS 124 (H) 01/09/2023 1638   BILITOT 0.2 01/09/2023 1638   GFRNONAA >60 04/07/2016 0600   GFRAA >60 04/07/2016 0600    Lab Results  Component Value Date   WBC 7.0 05/13/2022   HGB 14.5 05/13/2022   HCT 43.1 05/13/2022   MCV 91.6 05/13/2022   PLT 310.0 05/13/2022   Lab Results  Component Value Date   HGBA1C 6.7 (H) 01/09/2023   Lab Results  Component Value Date   CHOL 235 (H) 01/09/2023   HDL 38 (L) 01/09/2023   LDLCALC 143 (H) 01/09/2023   TRIG 294 (H) 01/09/2023   CHOLHDL 6.2 (H) 01/09/2023   Lab Results  Component Value Date   TSH 2.20 05/13/2022     Assessment and Plan:  Primary hypertension Assessment & Plan: Inadequate control.  Encouraged to retry amlodipine since it seems that his shaking may not even be a side effect of this.  We agreed on trial of amlodipine 5 mg daily, so he can split his current tablets in half.  Continue home blood pressure monitoring.  Orders: -     amLODIPine Besylate; Take 0.5 tablets (5 mg total) by mouth daily.  Noncompliance with medication regimen Assessment & Plan: Med list with  instructions printed for patient today.  Emphasized the importance of compliance.  He is to let me know if he has any issues with his medications, so that I can prescribe reasonable alternatives   Mixed hyperlipidemia Assessment & Plan: Patient agreed to try pravastatin at half the prescribed dose  Orders: -     Pravastatin Sodium; Take 0.5 tablets (10 mg total) by mouth daily.  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  Good glycemic control will be very important to prevent kidney damage, pressure control.  Plan to recheck A1c at our next visit.   Pulmonary emphysema, unspecified emphysema type (HCC) Assessment & Plan: Remains abstinent from cigarette smoking, congratulated on this.  Oxygenating well on room air.  Continue albuterol as needed   Aortic atherosclerosis (HCC) Assessment & Plan: Encouraged statin use  Orders: -     Pravastatin Sodium; Take 0.5 tablets (10 mg total) by mouth daily.     Follow-up with me as scheduled 04/25/2023   Alvester Morin, PA-C, DMSc, Nutritionist [facility] Primary [facility] Sports [facility] [phone]

## 2023-03-13 NOTE — Assessment & Plan Note (Signed)
Patient agreed to try pravastatin at half the prescribed dose

## 2023-03-13 NOTE — Assessment & Plan Note (Signed)
Encouraged statin use

## 2023-03-13 NOTE — Assessment & Plan Note (Signed)
It sounds like he has made some positive lifestyle changes.  Good glycemic control will be very important to prevent kidney damage, pressure control.  Plan to recheck A1c at our next visit.

## 2023-03-13 NOTE — Assessment & Plan Note (Signed)
Inadequate control.  Encouraged to retry amlodipine since it seems that his shaking may not even be a side effect of this.  We agreed on trial of amlodipine 5 mg daily, so he can split his current tablets in half.  Continue home blood pressure monitoring.

## 2023-04-02 ENCOUNTER — Ambulatory Visit: Payer: Medicare HMO | Admitting: Urology

## 2023-04-07 ENCOUNTER — Other Ambulatory Visit: Payer: Self-pay | Admitting: Physician Assistant

## 2023-04-07 DIAGNOSIS — E782 Mixed hyperlipidemia: Secondary | ICD-10-CM

## 2023-04-07 DIAGNOSIS — I700 Atherosclerosis of aorta: Secondary | ICD-10-CM

## 2023-04-08 NOTE — Telephone Encounter (Signed)
Requested medication (s) are due for refill today: "not specified"  Requested medication (s) are on the active medication list: yes  Last refill:  03/13/23  amount "not specified"  Future visit scheduled: yes  Notes to clinic:  please review- not sure amount dispensed on 03/13/23   Requested Prescriptions  Pending Prescriptions Disp Refills   pravastatin (PRAVACHOL) 20 MG tablet [Pharmacy Med Name: PRAVASTATIN SODIUM 20 MG TAB] 90 tablet     Sig: TAKE 1 TABLET BY MOUTH EVERY DAY     Cardiovascular:  Antilipid - Statins Failed - 04/08/2023  8:33 AM      Failed - Lipid Panel in normal range within the last 12 months    Cholesterol, Total  Date Value Ref Range Status  01/09/2023 235 (H) 100 - 199 mg/dL Final   LDL Chol Calc (NIH)  Date Value Ref Range Status  01/09/2023 143 (H) 0 - 99 mg/dL Final   HDL  Date Value Ref Range Status  01/09/2023 38 (L) >39 mg/dL Final   Triglycerides  Date Value Ref Range Status  01/09/2023 294 (H) 0 - 149 mg/dL Final         Passed - Patient is not pregnant      Passed - Valid encounter within last 12 months    Recent Outpatient Visits           3 weeks ago Primary hypertension   [place] [facility] at [facility], Melton Alar, [place]   1 month ago Bad odor of urine   [facility] at [employer], Melton Alar, [place]   2 months ago Primary hypertension   [place] [facility] at [facility], Melton Alar, [place]   3 months ago Insomnia, unspecified type   [facility] at [facility], Melton Alar, [place]   7 months ago Preoperative examination   [facility] at [facility], Melton Alar, [place]       Future Appointments             In 2 weeks Mordecai Maes, Melton Alar, PA [facility] at [facility], [facility]   In 2 weeks Lonna Cobb, Verna Czech, MD [facility] [place]

## 2023-04-08 NOTE — Telephone Encounter (Signed)
Please review.  KP

## 2023-04-25 ENCOUNTER — Encounter: Payer: Self-pay | Admitting: Physician Assistant

## 2023-04-25 ENCOUNTER — Ambulatory Visit: Payer: Medicare PPO | Admitting: Physician Assistant

## 2023-04-25 VITALS — BP 132/80 | HR 88 | Temp 98.2°F | Ht 66.0 in | Wt 186.0 lb

## 2023-04-25 DIAGNOSIS — J439 Emphysema, unspecified: Secondary | ICD-10-CM

## 2023-04-25 DIAGNOSIS — I10 Essential (primary) hypertension: Secondary | ICD-10-CM | POA: Diagnosis not present

## 2023-04-25 DIAGNOSIS — Z789 Other specified health status: Secondary | ICD-10-CM

## 2023-04-25 DIAGNOSIS — E1129 Type 2 diabetes mellitus with other diabetic kidney complication: Secondary | ICD-10-CM | POA: Diagnosis not present

## 2023-04-25 DIAGNOSIS — F17200 Nicotine dependence, unspecified, uncomplicated: Secondary | ICD-10-CM

## 2023-04-25 DIAGNOSIS — I700 Atherosclerosis of aorta: Secondary | ICD-10-CM | POA: Diagnosis not present

## 2023-04-25 DIAGNOSIS — R809 Proteinuria, unspecified: Secondary | ICD-10-CM | POA: Diagnosis not present

## 2023-04-25 LAB — POCT GLYCOSYLATED HEMOGLOBIN (HGB A1C): Hemoglobin A1C: 6.3 % — AB (ref 4.0–5.6)

## 2023-04-25 NOTE — Assessment & Plan Note (Signed)
 Statin intolerant, actively smoking.  Will continue to push for risk factor management.

## 2023-04-25 NOTE — Assessment & Plan Note (Signed)
 Will hold off on antilipid therapy for now with plan to discuss alternative to statin at a future visit.  It is not clear what labs he is having done today, possibly lipids, will not order any additional lab work at this time.

## 2023-04-25 NOTE — Assessment & Plan Note (Signed)
 Encouraged cessation.  Patient is precontemplative.

## 2023-04-25 NOTE — Assessment & Plan Note (Signed)
 Stable.  Continue albuterol as needed.  Recommended smoking cessation.

## 2023-04-25 NOTE — Assessment & Plan Note (Signed)
 A1c 6.3% today reflecting good glycemic control without medication.  Continue to follow clinically.

## 2023-04-25 NOTE — Progress Notes (Signed)
 Date:  04/25/2023   Name:  Riley Anderson   DOB:  09-06-1955   MRN:  [id number]   Chief Complaint: Medical Management of Chronic Issues and Hyperlipidemia (Not taking medication- makes joints hurt, hasn't taken in 3 weeks)  HPI Riley Anderson presents today for 6-week follow-up on chronic conditions, namely HTN and DM2 which is presently diet controlled, last A1c 6.7% in November and he is determined to avoid medication for this problem.    Home BP 130's systolic, last checked 1wk ago.  Reports good compliance with current antihypertensives.  He was unable to tolerate even very low-dose of pravastatin, and stopped this about 3 weeks ago due to joint pain, along with his anxiety medicine venlafaxine which he reports did not seem to help much and made him drowsy.  He does not presently want any anxiety medication.  Per patient, Dr. Odis Anderson is planning right hip arthroplasty at [facility], planning epidural. Tentative date 07/03/23.  He is apparently doing presurgical blood work this afternoon, but does not know what has been ordered for him.  Unfortunately had to reschedule his urology consult.  He has urology appt now scheduled for 04/28/2023.  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  Current Meds  Medication Sig   albuterol (VENTOLIN HFA) 108 (90 Base) MCG/ACT inhaler Inhale 2 puffs into the lungs every 6 (six) hours as needed for wheezing or shortness of breath.   amLODipine (NORVASC) 10 MG tablet Take 0.5 tablets (5 mg total) by mouth daily.   olmesartan (BENICAR) 40 MG tablet Take 1 tablet (40 mg total) by mouth daily.     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, without  long-term current use of insulin (HCC) 01/10/2023   Aortic atherosclerosis (HCC) 01/09/2023   Multiple renal cysts 01/09/2023   Hepatic cyst 01/09/2023   Anxiety 01/09/2023   Chronic pain of right hip 12/12/2022   Closed fracture of distal end of radius 09/10/2022   Mixed hyperlipidemia 08/21/2022   Low back pain radiating to left lower extremity 07/24/2022   Degenerative arthritis of lumbar spine 07/24/2022   Spondylosis of lumbar spine 07/24/2022   Osteoarthritis, multiple sites 07/24/2022   Elevated PSA 06/18/2022   GAD (generalized anxiety disorder) 06/18/2022   Primary hypertension 05/13/2022   Tobacco use disorder 05/13/2022   Emphysema/COPD (HCC) 05/13/2022   Overweight 05/13/2022    Allergies  Allergen Reactions   Other Anaphylaxis    Iron medication that starts with an "A"   Pseudoephedrine Hcl Other (See Comments)    Unknown   Sudafed [Pseudoephedrine Hcl]     Unknown   Percocet [Oxycodone-Acetaminophen] Rash    Immunization History  Administered Date(s) Administered   Tdap 04/07/2016    Past Surgical History:  Procedure Laterality Date   DENTAL SURGERY     I & D EXTREMITY Right 04/07/2016   Procedure: IRRIGATION AND DEBRIDEMENT RIGHT INDEX FINGER WITH REPAIRS AND RECONSTRUCTION AS NECESSARY;  Surgeon: Dairl Ponder, MD;  Location: [facility] OR;  Service: Orthopedics;  Laterality: Right;    Social History   Tobacco Use   Smoking status: Every Day    Current packs/day: 0.50    Average packs/day: 1 pack/day for 50.5 years (50.2 ttl pk-yrs)  Types: Cigarettes    Start date: 58    Last attempt to quit: 01/20/2023   Smokeless tobacco: Never  Vaping Use   Vaping status: Never Used  Substance Use Topics   Alcohol use: Not Currently   Drug use: No    Family History  Problem Relation Age of Onset   Arthritis Mother    Dementia Mother    Heart disease Father    Hypertension Father    COPD Father    Cancer Father    Melanoma Father    Diabetes Sister     Cancer Sister    Thyroid cancer Brother         04/25/2023    1:19 PM 03/13/2023    1:22 PM 02/24/2023   10:02 AM 01/09/2023    3:32 PM  GAD 7 : Generalized Anxiety Score  Nervous, Anxious, on Edge 0 0 1 1  Control/stop worrying 0 0 0 0  Worry too much - different things 0 0 0 0  Trouble relaxing 0 0 1 1  Restless 0 0 0 0  Easily annoyed or irritable 0 0 0 0  Afraid - awful might happen 0 0 0 0  Total GAD 7 Score 0 0 2 2  Anxiety Difficulty Not difficult at all Not difficult at all Not difficult at all Not difficult at all       04/25/2023    1:19 PM 03/13/2023    1:22 PM 02/24/2023   10:02 AM  Depression screen PHQ 2/9  Decreased Interest 0 0 0  Down, Depressed, Hopeless 0 0 0  PHQ - 2 Score 0 0 0  Altered sleeping   0  Tired, decreased energy   0  Change in appetite   0  Feeling bad or failure about yourself    0  Trouble concentrating   0  Moving slowly or fidgety/restless   0  Suicidal thoughts   0  PHQ-9 Score   0  Difficult doing work/chores   Not difficult at all    BP Readings from Last 3 Encounters:  04/25/23 132/80  03/13/23 (!) 150/90  02/24/23 (!) 142/92    Wt Readings from Last 3 Encounters:  04/25/23 186 lb (84.4 kg)  03/13/23 194 lb (88 kg)  02/24/23 194 lb (88 kg)    BP 132/80 (Cuff Size: Normal)   Pulse 88   Temp 98.2 F (36.8 C)   Ht 5\' 6"  (1.676 m)   Wt 186 lb (84.4 kg)   SpO2 95%   BMI 30.02 kg/m   Physical Exam Vitals and nursing note reviewed.  Constitutional:      Appearance: Normal appearance.  Cardiovascular:     Rate and Rhythm: Normal rate and regular rhythm.     Heart sounds: No murmur heard.    No friction rub. No gallop.  Pulmonary:     Effort: Pulmonary effort is normal.     Breath sounds: Decreased breath sounds present. No wheezing, rhonchi or rales.  Abdominal:     General: There is no distension.  Musculoskeletal:        General: Normal range of motion.  Skin:    General: Skin is warm and dry.   Neurological:     Mental Status: He is alert and oriented to person, place, and time.     Gait: Gait is intact.  Psychiatric:        Mood and Affect: Mood and affect normal.     Recent Labs  Component Value Date/Time   NA 143 01/09/2023 1638   K 4.6 01/09/2023 1638   CL 106 01/09/2023 1638   CO2 21 01/09/2023 1638   GLUCOSE 83 01/09/2023 1638   GLUCOSE 99 06/18/2022 1012   BUN 23 01/09/2023 1638   CREATININE 1.60 (H) 01/09/2023 1638   CALCIUM 10.1 01/09/2023 1638   PROT 7.2 01/09/2023 1638   ALBUMIN 4.6 01/09/2023 1638   AST 15 01/09/2023 1638   ALT 21 01/09/2023 1638   ALKPHOS 124 (H) 01/09/2023 1638   BILITOT 0.2 01/09/2023 1638   GFRNONAA >60 04/07/2016 0600   GFRAA >60 04/07/2016 0600    Lab Results  Component Value Date   WBC 7.0 05/13/2022   HGB 14.5 05/13/2022   HCT 43.1 05/13/2022   MCV 91.6 05/13/2022   PLT 310.0 05/13/2022   Lab Results  Component Value Date   HGBA1C 6.3 (A) 04/25/2023   Lab Results  Component Value Date   CHOL 235 (H) 01/09/2023   HDL 38 (L) 01/09/2023   LDLCALC 143 (H) 01/09/2023   TRIG 294 (H) 01/09/2023   CHOLHDL 6.2 (H) 01/09/2023   Lab Results  Component Value Date   TSH 2.20 05/13/2022     Assessment and Plan:  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  Continue to follow clinically.  Orders: -     POCT glycosylated hemoglobin (Hb A1C)  Primary hypertension Assessment & Plan: Well-controlled on current regimen, continue.   Pulmonary emphysema, unspecified emphysema type (HCC) Assessment & Plan: Stable.  Continue albuterol as needed.  Recommended smoking cessation.   Aortic atherosclerosis (HCC) Assessment & Plan: Statin intolerant, actively smoking.  Will continue to push for risk factor management.   Tobacco use disorder Assessment &  Plan: Encouraged cessation.  Patient is precontemplative.   Statin intolerance Assessment & Plan: Will hold off on antilipid therapy for now with plan to discuss alternative to statin at a future visit.  It is not clear what labs he is having done today, possibly lipids, will not order any additional lab work at this time.      Return in about 3 months (around 07/23/2023) for OV f/u chronic conditions.   Today's visit billed for provider time of 34 minutes inclusive of chart review, addressing multiple chronic conditions, physical exam, point-of-care testing with interpretation.  Alvester Morin, PA-C, DMSc, Nutritionist [facility] [phone]

## 2023-04-25 NOTE — Assessment & Plan Note (Signed)
Well controlled on current regimen, continue

## 2023-04-28 ENCOUNTER — Encounter: Payer: Self-pay | Admitting: Urology

## 2023-04-28 ENCOUNTER — Ambulatory Visit (INDEPENDENT_AMBULATORY_CARE_PROVIDER_SITE_OTHER): Payer: Medicare HMO | Admitting: Urology

## 2023-04-28 VITALS — BP 128/74 | HR 80 | Ht 64.0 in | Wt 184.0 lb

## 2023-04-28 DIAGNOSIS — N486 Induration penis plastica: Secondary | ICD-10-CM | POA: Insufficient documentation

## 2023-04-28 DIAGNOSIS — R972 Elevated prostate specific antigen [PSA]: Secondary | ICD-10-CM | POA: Diagnosis not present

## 2023-04-28 MED ORDER — TAMSULOSIN HCL 0.4 MG PO CAPS: 0.4000 mg | ORAL_CAPSULE | Freq: Every day | ORAL | 3 refills | Status: DC

## 2023-04-28 NOTE — Progress Notes (Signed)
 I, Maysun Anabel Bene, acting as a scribe for Riki Altes, MD., have documented all relevant documentation on the behalf of Riki Altes, MD, as directed by Riki Altes, MD while in the presence of Riki Altes, MD.  04/28/2023 11:43 AM   [name] [birth year] [id number]  Referring provider: Remo Lipps, PA [address] [place],  [place] [address]  Chief Complaint  Patient presents with   Other   Urologic history: 1. Elevated PSA Prostate MRI.   HPI: Riley Anderson is a 68 y.o. male presents for a follow-up visit.  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 Prostate biopsy was recommended based on abnormal PSA density and PIRADS III lesion. Patient refused biopsy. A repeat PSA 01/09/23 had increased to 10.8. His DRE was benign.  He presents today requesting an Rx for tamsulosin to shrink his prostate gland and hopefully lower his PSA.  No bothersome lower urinary tract symptoms.    PMH: Past Medical History:  Diagnosis Date   Arthritis    Chronic bronchitis (HCC)    Hypertension     Surgical History: Past Surgical History:  Procedure Laterality Date   DENTAL SURGERY     I & D EXTREMITY Right 04/07/2016   Procedure: IRRIGATION AND DEBRIDEMENT RIGHT INDEX FINGER WITH REPAIRS AND RECONSTRUCTION AS NECESSARY;  Surgeon: Dairl Ponder, MD;  Location: [facility] OR;  Service: Orthopedics;  Laterality: Right;    Home Medications:  Allergies as of 04/28/2023       Reactions   Other Anaphylaxis   Iron medication that starts with an "A"   Pseudoephedrine Hcl Other (See Comments)   Unknown   Sudafed [pseudoephedrine Hcl]    Unknown   Percocet [oxycodone-acetaminophen] Rash        Medication List        Accurate as of April 28, 2023 11:43 AM. If you have any questions, ask your nurse or doctor.          albuterol 108 (90 Base) MCG/ACT inhaler Commonly known as:  VENTOLIN HFA Inhale 2 puffs into the lungs every 6 (six) hours as needed for wheezing or shortness of breath.   amLODipine 10 MG tablet Commonly known as: NORVASC Take 0.5 tablets (5 mg total) by mouth daily.   olmesartan 40 MG tablet Commonly known as: BENICAR Take 1 tablet (40 mg total) by mouth daily.   tamsulosin 0.4 MG Caps capsule Commonly known as: FLOMAX Take 1 capsule (0.4 mg total) by mouth daily. Started by: Riki Altes        Allergies:  Allergies  Allergen Reactions   Other Anaphylaxis    Iron medication that starts with an "A"   Pseudoephedrine Hcl Other (See Comments)    Unknown   Sudafed [Pseudoephedrine Hcl]     Unknown   Percocet [Oxycodone-Acetaminophen] Rash    Family History: Family History  Problem Relation Age of Onset   Arthritis Mother    Dementia Mother    Heart disease Father    Hypertension Father    COPD Father    Cancer Father    Melanoma Father    Diabetes Sister    Cancer Sister    Thyroid cancer Brother     Social History:  reports that he has been smoking cigarettes. He started smoking about 51 years ago. He has a 50.2 pack-year smoking history.  He has never used smokeless tobacco. He reports that he does not currently use alcohol. He reports that he does not use drugs.   Physical Exam: BP 128/74   Pulse 80   Ht 5\' 4"  (1.626 m)   Wt 184 lb (83.5 kg)   BMI 31.58 kg/m   Constitutional:  Alert and oriented, No acute distress. HEENT: [place] AT, moist mucus membranes.  Trachea midline, no masses. Cardiovascular: No clubbing, cyanosis, or edema. Respiratory: Normal respiratory effort, no increased work of breathing. GI: Abdomen is soft, nontender, nondistended, no abdominal masses Skin: No rashes, bruises or suspicious lesions. Neurologic: Grossly intact, no focal deficits, moving all 4 extremities. Psychiatric: Normal mood and affect.   Pertinent Imaging: MRI was personally reviewed and interpreted.   MRI EXAM: MRI  ABDOMEN WITHOUT AND WITH CONTRAST   TECHNIQUE: Multiplanar multisequence MR imaging of the abdomen was performed both before and after the administration of intravenous contrast.   CONTRAST:  7.88mL GADAVIST GADOBUTROL 1 MMOL/ML IV SOLN   COMPARISON:  CT chest, 12/17/2022   FINDINGS: Lower chest: No acute abnormality.   Hepatobiliary: No solid liver abnormality is seen. Multiple benign simple and thinly septated fluid signal liver cysts, for which no further follow-up or characterization is required. No gallstones, gallbladder wall thickening, or biliary dilatation.   Pancreas: Unremarkable. No pancreatic ductal dilatation or surrounding inflammatory changes.   Spleen: Normal in size without significant abnormality.   Adrenals/Urinary Tract: Definitively benign, macroscopic fat containing right adrenal adenoma, for which no further follow-up or characterization is required (series 5, image 42). Multiple fluid signal right-sided renal cortical cysts, benign, for which no further follow-up or characterization is required. Additionally, within the right inferior pole there is an intrinsically T1 hyperintense hemorrhagic or proteinaceous cyst measuring 4.1 x 3.5 cm with thin, perceptibly enhancing internal septations (series 4, image 29, series 19, image 60). No obvious calculi or hydronephrosis.   Stomach/Bowel: Stomach is within normal limits. No evidence of bowel wall thickening, distention, or inflammatory changes.   Vascular/Lymphatic: Aortic atherosclerosis. No enlarged abdominal lymph nodes.   Other: No abdominal wall hernia or abnormality. No ascites.   Musculoskeletal: No acute or significant osseous findings.   IMPRESSION: 1. Within the right inferior pole there is an intrinsically T1 hyperintense hemorrhagic or proteinaceous cyst measuring 4.1 x 3.5 cm with thin, perceptibly enhancing internal septations. This is consistent with a Bosniak category IIF cyst. The  large majority of Bosniak IIF cysts are benign. When malignant, nearly all are indolent. Generally, Bosniak IIF cysts are followed by imaging at 6 months and 12 months (MRI preferred over CT), then annually for a total of 5 years to assess for morphologic change. (Reference: Bosniak Classification of Cystic Renal Masses, Version 2019. Radiology 2019; 292(2): 475-488.) 2. Additional simple benign renal cortical cysts for which no specific further follow-up or characterization is required. 3. No acute findings in the abdomen.   Aortic Atherosclerosis (ICD10-I70.0).     Electronically Signed   By: Jearld Lesch M.D.   On: 01/18/2023 07:28   Assessment & Plan:    1. Elevated PSA We discussed PIRADS III lesion and abnormal PSA density, indicating the need for a prostate biopsy. Discussed recommendation prostate biopsy and offer to schedule under anesthesia.  We did discuss tamsulosin does not shrink prostate, however it may reduce inflammation, and he was given an Rx.  He is scheduled for hip replacement surgery May 2025 and declined repeat PSA until after that visit and we are tentatively scheduled  June 2025.  [facility] [address] [place], [place] [address] [phone]

## 2023-05-24 ENCOUNTER — Other Ambulatory Visit: Payer: Self-pay | Admitting: Physician Assistant

## 2023-05-24 DIAGNOSIS — I10 Essential (primary) hypertension: Secondary | ICD-10-CM

## 2023-07-03 ENCOUNTER — Other Ambulatory Visit: Payer: Self-pay | Admitting: Physician Assistant

## 2023-07-03 DIAGNOSIS — I10 Essential (primary) hypertension: Secondary | ICD-10-CM

## 2023-07-03 DIAGNOSIS — J42 Unspecified chronic bronchitis: Secondary | ICD-10-CM

## 2023-07-03 NOTE — Telephone Encounter (Signed)
 Copied from CRM [id number]. Topic: Clinical - Medication Refill >> Jul 03, 2023 12:44 PM Emylou G wrote: Medication: albuterol  (VENTOLIN  HFA) 108 (90 Base) MCG/ACT inhaler amLODipine  (NORVASC ) 10 MG tablet olmesartan  (BENICAR ) 40 MG tablet  Medication was destroyed.. needs the refill  Was told to call [employer] because they won't pay for destroyed medication   Has the patient contacted their pharmacy? Yes (Agent: If no, request that the patient contact the pharmacy for the refill. If patient does not wish to contact the pharmacy document the reason why and proceed with request.) (Agent: If yes, when and what did the pharmacy advise?) said to call [employer] because they won't pay for destroyed medication  This is the patient's preferred pharmacy:  [id number] [place], [place] - [address] [place] [address] Phone: [phone] Fax: [phone]  Is this the correct pharmacy for this prescription? Yes If no, delete pharmacy and type the correct one.   Has the prescription been filled recently? Yes  Is the patient out of the medication? Yes  Has the patient been seen for an appointment in the last year OR does the patient have an upcoming appointment? Yes  Can we respond through MyChart? Yes  Agent: Please be advised that Rx refills may take up to 3 business days. We ask that you follow-up with your pharmacy.

## 2023-07-04 ENCOUNTER — Other Ambulatory Visit: Payer: Self-pay | Admitting: Physician Assistant

## 2023-07-04 ENCOUNTER — Telehealth: Payer: Self-pay | Admitting: Physician Assistant

## 2023-07-04 DIAGNOSIS — I10 Essential (primary) hypertension: Secondary | ICD-10-CM

## 2023-07-04 MED ORDER — AMLODIPINE BESYLATE 10 MG PO TABS: 10.0000 mg | ORAL_TABLET | Freq: Every day | ORAL | 0 refills | Status: DC

## 2023-07-04 MED ORDER — ALBUTEROL SULFATE HFA 108 (90 BASE) MCG/ACT IN AERS: 2.0000 | INHALATION_SPRAY | Freq: Four times a day (QID) | RESPIRATORY_TRACT | 11 refills | Status: AC | PRN

## 2023-07-04 MED ORDER — OLMESARTAN MEDOXOMIL 20 MG PO TABS: 40.0000 mg | ORAL_TABLET | Freq: Every day | ORAL | 1 refills | Status: DC

## 2023-07-04 NOTE — Telephone Encounter (Signed)
 Please review. JM

## 2023-07-04 NOTE — Telephone Encounter (Signed)
 Spoke with patient via phone.  Medication is inaccessible due to family strife.  Sent amlodipine  10 mg, olmesartan  20 mg (2 tablets daily), and albuterol  refills [facility] [address] [employer].

## 2023-07-04 NOTE — Telephone Encounter (Signed)
 Patient walked in today requesting medication for amlodipine , he has an appointment set up for this coming Monday for refills he stated that [facility] [address] [place].

## 2023-07-07 ENCOUNTER — Encounter: Payer: Self-pay | Admitting: Physician Assistant

## 2023-07-07 ENCOUNTER — Ambulatory Visit (INDEPENDENT_AMBULATORY_CARE_PROVIDER_SITE_OTHER): Admitting: Physician Assistant

## 2023-07-07 VITALS — BP 132/84 | Ht 64.0 in | Wt 186.8 lb

## 2023-07-07 DIAGNOSIS — Z789 Other specified health status: Secondary | ICD-10-CM

## 2023-07-07 DIAGNOSIS — Q6102 Congenital multiple renal cysts: Secondary | ICD-10-CM

## 2023-07-07 DIAGNOSIS — E1129 Type 2 diabetes mellitus with other diabetic kidney complication: Secondary | ICD-10-CM

## 2023-07-07 DIAGNOSIS — I10 Essential (primary) hypertension: Secondary | ICD-10-CM

## 2023-07-07 DIAGNOSIS — F17200 Nicotine dependence, unspecified, uncomplicated: Secondary | ICD-10-CM

## 2023-07-07 DIAGNOSIS — R809 Proteinuria, unspecified: Secondary | ICD-10-CM

## 2023-07-07 DIAGNOSIS — R972 Elevated prostate specific antigen [PSA]: Secondary | ICD-10-CM

## 2023-07-07 DIAGNOSIS — E782 Mixed hyperlipidemia: Secondary | ICD-10-CM

## 2023-07-07 DIAGNOSIS — N2889 Other specified disorders of kidney and ureter: Secondary | ICD-10-CM

## 2023-07-07 MED ORDER — AMLODIPINE BESYLATE 10 MG PO TABS: 10.0000 mg | ORAL_TABLET | Freq: Every day | ORAL | 2 refills | Status: AC

## 2023-07-07 MED ORDER — OLMESARTAN MEDOXOMIL 40 MG PO TABS: 40.0000 mg | ORAL_TABLET | Freq: Every day | ORAL | 2 refills | Status: DC

## 2023-07-07 NOTE — Assessment & Plan Note (Signed)
 Patient is noncompliant/unwilling to take any statin medications.  Will recheck lipids today and consider nonstatin alternatives in the future.

## 2023-07-07 NOTE — Assessment & Plan Note (Signed)
 Multiple benign cysts per MRI 01/16/2023, though a Bosniak IIF in the right inferior pole warrants repeat renal MRI

## 2023-07-07 NOTE — Assessment & Plan Note (Signed)
 Seems reasonably well-controlled with the current regimen, continue as prescribed

## 2023-07-07 NOTE — Assessment & Plan Note (Signed)
 Not quite due for A1c repeat, last was 6.3%.  Will check next time.

## 2023-07-07 NOTE — Telephone Encounter (Signed)
 Duplicate request- Rx // #90 2RF Requested Prescriptions  Pending Prescriptions Disp Refills   amLODipine  (NORVASC ) 10 MG tablet [Pharmacy Med Name: AMLODIPINE  BESYLATE 10 MG TAB] 90 tablet 1    Sig: TAKE 1 TABLET BY MOUTH EVERY DAY     Cardiovascular: Calcium  Channel Blockers 2 Passed - 07/07/2023 12:05 PM      Passed - Last BP in normal range    BP Readings from Last 1 Encounters:  07/07/23 132/84         Passed - Last Heart Rate in normal range    Pulse Readings from Last 1 Encounters:  04/28/23 80         Passed - Valid encounter within last 6 months    Recent Outpatient Visits           Today Multiple renal cysts   [facility] at [facility], [place]leen [place], [place]   2 months ago 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

## 2023-07-07 NOTE — Progress Notes (Signed)
 Date:  07/07/2023   Name:  Riley Anderson   DOB:  07-31-1955   MRN:  [id number]   Chief Complaint: Medical Management of Chronic Issues  HPI Riley Anderson presents today for routine f/u on chronic conditions.  He started back smoking again, so his right hip replacement was delayed by surgeon, needs to reschedule this.  Had recent falling out with his daughter, and is currently living with his brother.  Unfortunately, his daughter helped him stay on top of his medical care and keep a good medical history.  He has once again stopped pravastatin  citing arthralgias.  Does not want to take any more statins.  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  Last imaged 01/16/2023, suggested 69-month follow-up "imaging at 6 months and 12 months (MRI preferred over CT), then annually for a total of 5 years to assess for morphologic change."  Due for repeat labs  Medication list has been reviewed and updated.  Current Meds  Medication Sig   albuterol  (VENTOLIN  HFA) 108 (90 Base) MCG/ACT inhaler Inhale 2 puffs into the lungs every 6 (six) hours as needed for wheezing or shortness of breath.   [DISCONTINUED] amLODipine  (NORVASC ) 10 MG tablet Take 1 tablet (10 mg total) by mouth daily.   [DISCONTINUED] olmesartan  (BENICAR ) 20 MG tablet Take 2 tablets (40 mg total) by mouth daily.     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, without long-term current use of insulin (HCC) 01/10/2023   Aortic atherosclerosis (HCC) 01/09/2023   Multiple renal cysts 01/09/2023   Hepatic cyst 01/09/2023   Anxiety 01/09/2023   Chronic pain of right hip 12/12/2022   Closed fracture of distal end of radius 09/10/2022    Mixed hyperlipidemia 08/21/2022   Low back pain radiating to left lower extremity 07/24/2022   Degenerative arthritis of lumbar spine 07/24/2022   Spondylosis of lumbar spine 07/24/2022   Osteoarthritis, multiple sites 07/24/2022   Elevated PSA 06/18/2022   GAD (generalized anxiety disorder) 06/18/2022   Primary hypertension 05/13/2022   Tobacco use disorder 05/13/2022   Emphysema/COPD (HCC) 05/13/2022   Overweight 05/13/2022    Allergies  Allergen Reactions   Other Anaphylaxis    Iron medication that starts with an "A"   Pseudoephedrine Hcl Other (See Comments)    Unknown   Statins Other (See Comments)    Arthralgia. Has tried at least 3 statins.    Sudafed [Pseudoephedrine Hcl]     Unknown   Percocet [Oxycodone -Acetaminophen ] Rash    Immunization History  Administered Date(s) Administered   Tdap 04/07/2016    Past Surgical History:  Procedure Laterality Date   DENTAL SURGERY     I & D EXTREMITY Right 04/07/2016   Procedure: IRRIGATION AND DEBRIDEMENT RIGHT INDEX FINGER WITH REPAIRS AND RECONSTRUCTION AS NECESSARY;  Surgeon: Florida Hurter, MD;  Location: [facility] OR;  Service: Orthopedics;  Laterality: Right;    Social History   Tobacco Use   Smoking status: Every Day    Current packs/day: 0.50    Average packs/day: 1 pack/day for 50.7 years (50.3 ttl pk-yrs)    Types: Cigarettes    Start date: 45    Last attempt to quit: 01/20/2023   Smokeless tobacco: Never  Vaping Use  Vaping status: Never Used  Substance Use Topics   Alcohol use: Not Currently   Drug use: No    Family History  Problem Relation Age of Onset   Arthritis Mother    Dementia Mother    Heart disease Father    Hypertension Father    COPD Father    Cancer Father    Melanoma Father    Diabetes Sister    Cancer Sister    Thyroid  cancer Brother         04/25/2023    1:19 PM 03/13/2023    1:22 PM 02/24/2023   10:02 AM 01/09/2023    3:32 PM  GAD 7 : Generalized Anxiety Score   Nervous, Anxious, on Edge 0 0 1 1  Control/stop worrying 0 0 0 0  Worry too much - different things 0 0 0 0  Trouble relaxing 0 0 1 1  Restless 0 0 0 0  Easily annoyed or irritable 0 0 0 0  Afraid - awful might happen 0 0 0 0  Total GAD 7 Score 0 0 2 2  Anxiety Difficulty Not difficult at all Not difficult at all Not difficult at all Not difficult at all       07/07/2023    9:19 AM 04/25/2023    1:19 PM 03/13/2023    1:22 PM  Depression screen PHQ 2/9  Decreased Interest 0 0 0  Down, Depressed, Hopeless 0 0 0  PHQ - 2 Score 0 0 0    BP Readings from Last 3 Encounters:  07/07/23 132/84  04/28/23 128/74  04/25/23 132/80    Wt Readings from Last 3 Encounters:  07/07/23 186 lb 12.8 oz (84.7 kg)  04/28/23 184 lb (83.5 kg)  04/25/23 186 lb (84.4 kg)    BP 132/84 (BP Location: Right Arm, Patient Position: Sitting, Cuff Size: Normal)   Ht 5\' 4"  (1.626 m)   Wt 186 lb 12.8 oz (84.7 kg)   BMI 32.06 kg/m   Physical Exam Vitals and nursing note reviewed.  Constitutional:      Appearance: Normal appearance.  Cardiovascular:     Rate and Rhythm: Normal rate.  Pulmonary:     Effort: Pulmonary effort is normal.  Abdominal:     General: There is no distension.  Musculoskeletal:        General: Normal range of motion.     Comments: Antalgic gait  Skin:    General: Skin is warm and dry.  Neurological:     Mental Status: He is alert and oriented to person, place, and time.  Psychiatric:        Mood and Affect: Mood and affect normal.     Recent Labs     Component Value Date/Time   NA 143 01/09/2023 1638   K 4.6 01/09/2023 1638   CL 106 01/09/2023 1638   CO2 21 01/09/2023 1638   GLUCOSE 83 01/09/2023 1638   GLUCOSE 99 06/18/2022 1012   BUN 23 01/09/2023 1638   CREATININE 1.60 (H) 01/09/2023 1638   CALCIUM  10.1 01/09/2023 1638   PROT 7.2 01/09/2023 1638   ALBUMIN 4.6 01/09/2023 1638   AST 15 01/09/2023 1638   ALT 21 01/09/2023 1638   ALKPHOS 124 (H) 01/09/2023  1638   BILITOT 0.2 01/09/2023 1638   GFRNONAA >60 04/07/2016 0600   GFRAA >60 04/07/2016 0600    Lab Results  Component Value Date   WBC 7.0 05/13/2022   HGB 14.5 05/13/2022   HCT 43.1 05/13/2022  MCV 91.6 05/13/2022   PLT 310.0 05/13/2022   Lab Results  Component Value Date   HGBA1C 6.3 (A) 04/25/2023   Lab Results  Component Value Date   CHOL 235 (H) 01/09/2023   HDL 38 (L) 01/09/2023   LDLCALC 143 (H) 01/09/2023   TRIG 294 (H) 01/09/2023   CHOLHDL 6.2 (H) 01/09/2023   Lab Results  Component Value Date   TSH 2.20 05/13/2022     Assessment and Plan:  Primary hypertension Assessment & Plan: Seems reasonably well-controlled with the current regimen, continue as prescribed  Orders: -     Olmesartan  Medoxomil; Take 1 tablet (40 mg total) by mouth daily.  Dispense: 90 tablet; Refill: 2 -     amLODIPine  Besylate; Take 1 tablet (10 mg total) by mouth daily.  Dispense: 90 tablet; Refill: 2  Tobacco use disorder Assessment & Plan: Encouraged cessation, he is contemplative, knows he will need to stop at the very least for his surgery.  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  Will check next time.  Orders: -     CBC with Differential/Platelet -     Comprehensive metabolic panel with GFR -     Lipid panel -     Microalbumin / creatinine urine ratio  Statin intolerance Assessment & Plan: Patient is noncompliant/unwilling to take any statin medications.  Will recheck lipids today and consider nonstatin alternatives in the future.   Mixed hyperlipidemia Assessment & Plan: Patient is noncompliant/unwilling to take any statin medications.  Will recheck lipids today and consider nonstatin alternatives in the future.  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    Cody Das, PA-C, DMSc, Nutritionist [facility] [phone]

## 2023-07-07 NOTE — Assessment & Plan Note (Signed)
 Encouraged cessation, he is contemplative, knows he will need to stop at the very least for his surgery.  Really needs to reschedule his surgery as soon as possible

## 2023-07-08 LAB — COMPREHENSIVE METABOLIC PANEL WITH GFR
ALT: 16 IU/L (ref 0–44)
AST: 15 IU/L (ref 0–40)
Albumin: 4.4 g/dL (ref 3.9–4.9)
Alkaline Phosphatase: 106 IU/L (ref 44–121)
BUN/Creatinine Ratio: 15 (ref 10–24)
BUN: 21 mg/dL (ref 8–27)
Bilirubin Total: 0.2 mg/dL (ref 0.0–1.2)
CO2: 21 mmol/L (ref 20–29)
Calcium: 9.6 mg/dL (ref 8.6–10.2)
Chloride: 103 mmol/L (ref 96–106)
Creatinine, Ser: 1.43 mg/dL — ABNORMAL HIGH (ref 0.76–1.27)
Globulin, Total: 2 g/dL (ref 1.5–4.5)
Glucose: 105 mg/dL — ABNORMAL HIGH (ref 70–99)
Potassium: 5 mmol/L (ref 3.5–5.2)
Sodium: 139 mmol/L (ref 134–144)
Total Protein: 6.4 g/dL (ref 6.0–8.5)
eGFR: 54 mL/min/{1.73_m2} — ABNORMAL LOW (ref >59)

## 2023-07-08 LAB — CBC WITH DIFFERENTIAL/PLATELET
Basophils Absolute: 0.1 10*3/uL (ref 0.0–0.2)
Basos: 1 %
EOS (ABSOLUTE): 0.2 10*3/uL (ref 0.0–0.4)
Eos: 3 %
Hematocrit: 40.3 % (ref 37.5–51.0)
Hemoglobin: 13.5 g/dL (ref 13.0–17.7)
Immature Grans (Abs): 0.2 10*3/uL — ABNORMAL HIGH (ref 0.0–0.1)
Immature Granulocytes: 2 %
Lymphocytes Absolute: 1.3 10*3/uL (ref 0.7–3.1)
Lymphs: 18 %
MCH: 30.7 pg (ref 26.6–33.0)
MCHC: 33.5 g/dL (ref 31.5–35.7)
MCV: 92 fL (ref 79–97)
Monocytes Absolute: 0.8 10*3/uL (ref 0.1–0.9)
Monocytes: 10 %
Neutrophils Absolute: 4.9 10*3/uL (ref 1.4–7.0)
Neutrophils: 66 %
Platelets: 279 10*3/uL (ref 150–450)
RBC: 4.4 x10E6/uL (ref 4.14–5.80)
RDW: 12.9 % (ref 11.6–15.4)
WBC: 7.5 10*3/uL (ref 3.4–10.8)

## 2023-07-08 LAB — MICROALBUMIN / CREATININE URINE RATIO
Creatinine, Urine: 145.9 mg/dL
Microalb/Creat Ratio: 138 mg/g{creat} — ABNORMAL HIGH (ref 0–29)
Microalbumin, Urine: 201.4 ug/mL

## 2023-07-08 LAB — PSA: Prostate Specific Ag, Serum: 10.2 ng/mL — ABNORMAL HIGH (ref 0.0–4.0)

## 2023-07-08 LAB — LIPID PANEL
Chol/HDL Ratio: 5 ratio (ref 0.0–5.0)
Cholesterol, Total: 181 mg/dL (ref 100–199)
HDL: 36 mg/dL — ABNORMAL LOW (ref >39)
LDL Chol Calc (NIH): 116 mg/dL — ABNORMAL HIGH (ref 0–99)
Triglycerides: 164 mg/dL — ABNORMAL HIGH (ref 0–149)
VLDL Cholesterol Cal: 29 mg/dL (ref 5–40)

## 2023-07-09 ENCOUNTER — Ambulatory Visit: Payer: Self-pay | Admitting: Physician Assistant

## 2023-07-10 ENCOUNTER — Ambulatory Visit

## 2023-07-18 ENCOUNTER — Telehealth: Payer: Self-pay | Admitting: Physician Assistant

## 2023-07-18 NOTE — Telephone Encounter (Signed)
Please call pt to schedule an appointment.  KP

## 2023-07-18 NOTE — Telephone Encounter (Signed)
 Copied from CRM [id number]. Topic: Appointments - Scheduling Inquiry for Clinic >> Jul 18, 2023  3:37 PM Riley Anderson wrote: Reason for CRM: Patient is having hip replacement surgery on 6/5 and the surgeon (Dr. Mathews Anderson) is requesting the patient gets his A1C checked.  The patient would like to come on 07/23/23 before 2pm. Please contact the patient at [phone]

## 2023-07-22 ENCOUNTER — Ambulatory Visit (INDEPENDENT_AMBULATORY_CARE_PROVIDER_SITE_OTHER): Admitting: Physician Assistant

## 2023-07-22 ENCOUNTER — Encounter: Payer: Self-pay | Admitting: Physician Assistant

## 2023-07-22 VITALS — BP 130/80 | HR 76 | Temp 97.5°F | Ht 64.0 in | Wt 193.0 lb

## 2023-07-22 DIAGNOSIS — R809 Proteinuria, unspecified: Secondary | ICD-10-CM | POA: Diagnosis not present

## 2023-07-22 DIAGNOSIS — E1129 Type 2 diabetes mellitus with other diabetic kidney complication: Secondary | ICD-10-CM | POA: Diagnosis not present

## 2023-07-22 LAB — POCT GLYCOSYLATED HEMOGLOBIN (HGB A1C): Hemoglobin A1C: 6.6 % — AB (ref 4.0–5.6)

## 2023-07-22 NOTE — Assessment & Plan Note (Signed)
 A1c 6.6% today, stable and diet-controlled.   Continue diet low in simple carbohydrates such as white starches (bread, pasta, rice) and refined sugar found in desserts and sweetened beverages including juice, sweet tea, and soda.

## 2023-07-22 NOTE — Progress Notes (Signed)
 Date:  07/22/2023   Name:  Riley Anderson   DOB:  01-13-1956   MRN:  [id number]   Chief Complaint: Diabetes  HPI Ray returns for follow-up today, primarily to complete point-of-care A1c as requested by his orthopedic surgeon.  He was last seen by me 07/07/2023 we reviewed his chronic conditions and repeated some labs, which showed stable but elevated PSA, microalbuminuria, stable renal function, and improvement in cholesterol parameters.  At that visit I ordered a repeat abdominal MRI to follow-up on multiple benign renal cysts and a Bosniak IIF -he has scheduled this MRI for 09/09/2023.   Medication list has been reviewed and updated.  Current Meds  Medication Sig   albuterol  (VENTOLIN  HFA) 108 (90 Base) MCG/ACT inhaler Inhale 2 puffs into the lungs every 6 (six) hours as needed for wheezing or shortness of breath.   amLODipine  (NORVASC ) 10 MG tablet Take 1 tablet (10 mg total) by mouth daily.   olmesartan  (BENICAR ) 40 MG tablet Take 1 tablet (40 mg total) by mouth daily.     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, without long-term current use of insulin (HCC) 01/10/2023   Aortic atherosclerosis (HCC) 01/09/2023   Multiple renal cysts 01/09/2023   Hepatic cyst 01/09/2023   Anxiety 01/09/2023   Chronic pain of right hip 12/12/2022   Closed fracture of distal end of radius 09/10/2022   Mixed hyperlipidemia 08/21/2022   Low back pain radiating to left lower extremity 07/24/2022   Degenerative arthritis of lumbar spine 07/24/2022   Spondylosis of lumbar spine 07/24/2022   Osteoarthritis, multiple sites 07/24/2022   Elevated PSA 06/18/2022   GAD (generalized anxiety disorder) 06/18/2022   Primary hypertension 05/13/2022   Tobacco  use disorder 05/13/2022   Emphysema/COPD (HCC) 05/13/2022   Overweight 05/13/2022    Allergies  Allergen Reactions   Other Anaphylaxis    Iron medication that starts with an "A"   Pseudoephedrine Hcl Other (See Comments)    Unknown   Statins Other (See Comments)    Arthralgia. Has tried at least 3 statins.    Sudafed [Pseudoephedrine Hcl]     Unknown   Percocet [Oxycodone -Acetaminophen ] Rash    Immunization History  Administered Date(s) Administered   Tdap 04/07/2016    Past Surgical History:  Procedure Laterality Date   DENTAL SURGERY     I & D EXTREMITY Right 04/07/2016   Procedure: IRRIGATION AND DEBRIDEMENT RIGHT INDEX FINGER WITH REPAIRS AND RECONSTRUCTION AS NECESSARY;  Surgeon: Florida Hurter, MD;  Location: [facility] OR;  Service: Orthopedics;  Laterality: Right;    Social History   Tobacco Use   Smoking status: Former    Current packs/day: 0.00    Average packs/day: 1 pack/day for 50.7 years (50.3 ttl pk-yrs)    Types: Cigarettes    Start date: 70    Quit date: 07/01/2023    Years since quitting: 0.0   Smokeless tobacco: Never  Vaping Use   Vaping status: Never Used  Substance Use Topics   Alcohol use: Not Currently   Drug use: No    Family History  Problem Relation Age of Onset   Arthritis Mother    Dementia Mother    Heart disease Father    Hypertension Father    COPD Father  Cancer Father    Melanoma Father    Diabetes Sister    Cancer Sister    Thyroid  cancer Brother         07/22/2023    8:37 AM 04/25/2023    1:19 PM 03/13/2023    1:22 PM 02/24/2023   10:02 AM  GAD 7 : Generalized Anxiety Score  Nervous, Anxious, on Edge 0 0 0 1  Control/stop worrying 0 0 0 0  Worry too much - different things 0 0 0 0  Trouble relaxing 0 0 0 1  Restless 0 0 0 0  Easily annoyed or irritable 0 0 0 0  Afraid - awful might happen 0 0 0 0  Total GAD 7 Score 0 0 0 2  Anxiety Difficulty Not difficult at all Not difficult at all Not difficult at all Not  difficult at all       07/22/2023    8:37 AM 07/07/2023    9:19 AM 04/25/2023    1:19 PM  Depression screen PHQ 2/9  Decreased Interest 0 0 0  Down, Depressed, Hopeless 0 0 0  PHQ - 2 Score 0 0 0    BP Readings from Last 3 Encounters:  07/22/23 130/80  07/07/23 132/84  04/28/23 128/74    Wt Readings from Last 3 Encounters:  07/22/23 193 lb (87.5 kg)  07/07/23 186 lb 12.8 oz (84.7 kg)  04/28/23 184 lb (83.5 kg)    BP 130/80   Pulse 76   Temp (!) 97.5 F (36.4 C)   Ht 5\' 4"  (1.626 m)   Wt 193 lb (87.5 kg)   SpO2 97%   BMI 33.13 kg/m   Physical Exam Vitals and nursing note reviewed.  Constitutional:      Appearance: Normal appearance.  Cardiovascular:     Rate and Rhythm: Normal rate.  Pulmonary:     Effort: Pulmonary effort is normal.  Abdominal:     General: There is no distension.  Musculoskeletal:        General: Normal range of motion.     Comments: Antalgic gait  Skin:    General: Skin is warm and dry.  Neurological:     Mental Status: He is alert and oriented to person, place, and time.  Psychiatric:        Mood and Affect: Mood and affect normal.     Recent Labs     Component Value Date/Time   NA 139 07/07/2023 1003   K 5.0 07/07/2023 1003   CL 103 07/07/2023 1003   CO2 21 07/07/2023 1003   GLUCOSE 105 (H) 07/07/2023 1003   GLUCOSE 99 06/18/2022 1012   BUN 21 07/07/2023 1003   CREATININE 1.43 (H) 07/07/2023 1003   CALCIUM  9.6 07/07/2023 1003   PROT 6.4 07/07/2023 1003   ALBUMIN 4.4 07/07/2023 1003   AST 15 07/07/2023 1003   ALT 16 07/07/2023 1003   ALKPHOS 106 07/07/2023 1003   BILITOT 0.2 07/07/2023 1003   GFRNONAA >60 04/07/2016 0600   GFRAA >60 04/07/2016 0600    Lab Results  Component Value Date   WBC 7.5 07/07/2023   HGB 13.5 07/07/2023   HCT 40.3 07/07/2023   MCV 92 07/07/2023   PLT 279 07/07/2023   Lab Results  Component Value Date   HGBA1C 6.6 (A) 07/22/2023   Lab Results  Component Value Date   CHOL 181 07/07/2023    HDL 36 (L) 07/07/2023   LDLCALC 116 (H) 07/07/2023   TRIG 164 (H)  07/07/2023   CHOLHDL 5.0 07/07/2023   Lab Results  Component Value Date   TSH 2.20 05/13/2022    Lab Results  Component Value Date   HGBA1C 6.6 (A) 07/22/2023   HGBA1C 6.3 (A) 04/25/2023   HGBA1C 6.7 (H) 01/09/2023     Assessment and Plan:  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   Continue diet low in simple carbohydrates such as white starches (bread, pasta, rice) and refined sugar found in desserts and sweetened beverages including juice, sweet tea, and soda.    Orders: -     POCT glycosylated hemoglobin (Hb A1C)     Return in about 4 months (around 11/22/2023) for OV f/u chronic conditions.    Cody Das, PA-C, DMSc, Nutritionist [facility] Primary [facility] and Sports Medicine [facility] [phone]

## 2023-07-29 ENCOUNTER — Other Ambulatory Visit

## 2023-08-22 ENCOUNTER — Ambulatory Visit: Payer: Self-pay

## 2023-08-22 NOTE — Telephone Encounter (Signed)
 FYI Only or Action Required?: Action required by provider: request for appointment.  Patient was last seen in primary care on 07/22/2023 by Manya Toribio SQUIBB, PA. Called Nurse Triage reporting Dizziness. Symptoms began today. Interventions attempted: Nothing. Symptoms are: stable.  Triage Disposition: See PCP When Office is Open (Within 3 Days)  Patient/caregiver understands and will follow disposition?: YesCopied from CRM [id number]. Topic: Clinical - Red Word Triage >> Aug 22, 2023  3:16 PM Fonda T wrote: [facility] that prompted transfer to Nurse Triage: Patient calling, states just experienced sudden light headedness, blurred vision, and dizziness. Reason for Disposition  [1] MODERATE dizziness (e.g., interferes with normal activities) AND [2] has been evaluated by doctor (or NP/PA) for this  Answer Assessment - Initial Assessment Questions 1. DESCRIPTION: Describe your dizziness.     Just dizzy 2. LIGHTHEADED: Do you feel lightheaded? (e.g., somewhat faint, woozy, weak upon standing)     Denies it currently  3. VERTIGO: Do you feel like either you or the room is spinning or tilting? (i.e. vertigo)     na 4. SEVERITY: How bad is it?  Do you feel like you are going to faint? Can you stand and walk?   - MILD: Feels slightly dizzy, but walking normally.   - MODERATE: Feels unsteady when walking, but not falling; interferes with normal activities (e.g., school, work).   - SEVERE: Unable to walk without falling, or requires assistance to walk without falling; feels like passing out now.      mild 5. ONSET:  When did the dizziness begin?     Earlier today  6. AGGRAVATING FACTORS: Does anything make it worse? (e.g., standing, change in head position)     Getting hot 7. HEART RATE: Can you tell me your heart rate? How many beats in 15 seconds?  (Note: not all patients can do this)       No sure 8. CAUSE: What do you think is causing the dizziness?     Not sure  10. OTHER  SYMPTOMS: Do you have any other symptoms? (e.g., fever, chest pain, vomiting, diarrhea, bleeding)       Blurred vision   Pt had been out in heat and feels this is cause for dizziness. Pt denied feeling this way during triage. Pt did have blurry vision. Pt stated he has had 3 bottles of water  today along with coffee. RN advised he seek care at [facility] if symptoms worsen over weekend. Appt 6/30 with PCP.  Protocols used: Dizziness - Lightheadedness-A-AH

## 2023-08-25 ENCOUNTER — Ambulatory Visit (INDEPENDENT_AMBULATORY_CARE_PROVIDER_SITE_OTHER): Admitting: Physician Assistant

## 2023-08-25 ENCOUNTER — Encounter: Payer: Self-pay | Admitting: Physician Assistant

## 2023-08-25 VITALS — BP 124/82 | HR 92 | Temp 97.9°F | Ht 64.0 in | Wt 188.0 lb

## 2023-08-25 DIAGNOSIS — R42 Dizziness and giddiness: Secondary | ICD-10-CM

## 2023-08-25 NOTE — Progress Notes (Signed)
 Date:  08/25/2023   Name:  Riley Anderson   DOB:  11/03/1955   MRN:  [id number]   Chief Complaint: Dizziness (Happened for 2 days, no longer dizzy today, stated his in home doctor stated it may be his BP medication causing his BP to drop low BP was 112/72)  Dizziness This is a new problem. Episode onset: 2 days.   Meghan had some dizziness Friday and Saturday, initially thought it was hypotension but his readings have been normal. He tells me that he recently replaced the Freon in one of his vehicles and now suspects that a Freon leak was the cause, since his symptoms improved when staying at home and worsened when driving the car again yesterday. At the time of our visit, he has no symptoms.   He says a nurse comes to his house weekly and checks blood pressure, usually gets 110's-120's systolic which patient is concerned might be low. He has not had any lightheadedness outside of these recent events in the last few days for which he offers an alternative explanation.   His daughter has urged him to check his own blood pressure, which I think is a great idea. He has not yet purchased a blood pressure cuff despite our encouragement.    Medication list has been reviewed and updated.  Current Meds  Medication Sig   albuterol  (VENTOLIN  HFA) 108 (90 Base) MCG/ACT inhaler Inhale 2 puffs into the lungs every 6 (six) hours as needed for wheezing or shortness of breath.   amLODipine  (NORVASC ) 10 MG tablet Take 1 tablet (10 mg total) by mouth daily.   [employer] ASPIRIN LOW DOSE 81 MG tablet Take 81 mg by mouth 2 (two) times daily.   olmesartan  (BENICAR ) 40 MG tablet Take 1 tablet (40 mg total) by mouth daily.     Review of Systems  Neurological:  Positive for dizziness.    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, without long-term current use of insulin (HCC) 01/10/2023   Aortic atherosclerosis (HCC) 01/09/2023   Multiple renal cysts 01/09/2023   Hepatic cyst 01/09/2023   Anxiety 01/09/2023   Chronic pain of right hip 12/12/2022   Closed fracture of distal end of radius 09/10/2022   Mixed hyperlipidemia 08/21/2022   Low back pain radiating to left lower extremity 07/24/2022   Degenerative arthritis of lumbar spine 07/24/2022   Spondylosis of lumbar spine 07/24/2022   Osteoarthritis, multiple sites 07/24/2022   Elevated PSA 06/18/2022   GAD (generalized anxiety disorder) 06/18/2022   Primary hypertension 05/13/2022   Tobacco use disorder 05/13/2022   Emphysema/COPD (HCC) 05/13/2022   Overweight 05/13/2022    Allergies  Allergen Reactions   Other Anaphylaxis    Iron medication that starts with an A   Pseudoephedrine Hcl Other (See Comments)    Unknown   Statins Other (See Comments)    Arthralgia. Has tried at least 3 statins.    Sudafed [Pseudoephedrine Hcl]     Unknown   Percocet [Oxycodone -Acetaminophen ] Rash    Immunization History  Administered Date(s) Administered   Tdap 04/07/2016    Past Surgical History:  Procedure Laterality Date   DENTAL SURGERY     I & D EXTREMITY Right 04/07/2016   Procedure: IRRIGATION AND DEBRIDEMENT RIGHT INDEX FINGER WITH REPAIRS AND RECONSTRUCTION AS  NECESSARY;  Surgeon: Donnice Robinsons, MD;  Location: [facility] OR;  Service: Orthopedics;  Laterality: Right;   TOTAL HIP ARTHROPLASTY Right     Social History   Tobacco Use   Smoking status: Former    Current packs/day: 0.00    Average packs/day: 1 pack/day for 50.7 years (50.3 ttl pk-yrs)    Types: Cigarettes    Start date: 80    Quit date: 07/01/2023    Years since quitting: 0.1   Smokeless tobacco: Never  Vaping Use   Vaping status: Never Used  Substance Use Topics   Alcohol use: Not Currently   Drug use: No    Family History  Problem  Relation Age of Onset   Arthritis Mother    Dementia Mother    Heart disease Father    Hypertension Father    COPD Father    Cancer Father    Melanoma Father    Diabetes Sister    Cancer Sister    Thyroid  cancer Brother         08/25/2023    9:22 AM 07/22/2023    8:37 AM 04/25/2023    1:19 PM 03/13/2023    1:22 PM  GAD 7 : Generalized Anxiety Score  Nervous, Anxious, on Edge 0 0 0 0  Control/stop worrying 0 0 0 0  Worry too much - different things 0 0 0 0  Trouble relaxing 0 0 0 0  Restless 0 0 0 0  Easily annoyed or irritable 0 0 0 0  Afraid - awful might happen 0 0 0 0  Total GAD 7 Score 0 0 0 0  Anxiety Difficulty Not difficult at all Not difficult at all Not difficult at all Not difficult at all       08/25/2023    9:22 AM 07/22/2023    8:37 AM 07/07/2023    9:19 AM  Depression screen PHQ 2/9  Decreased Interest 0 0 0  Down, Depressed, Hopeless 0 0 0  PHQ - 2 Score 0 0 0    BP Readings from Last 3 Encounters:  08/25/23 124/82  07/22/23 130/80  07/07/23 132/84    Wt Readings from Last 3 Encounters:  08/25/23 188 lb (85.3 kg)  07/22/23 193 lb (87.5 kg)  07/07/23 186 lb 12.8 oz (84.7 kg)    BP 124/82 (Cuff Size: Normal)   Pulse 92   Temp 97.9 F (36.6 C)   Ht 5' 4 (1.626 m)   Wt 188 lb (85.3 kg)   SpO2 97%   BMI 32.27 kg/m   Physical Exam Vitals and nursing note reviewed.  Constitutional:      Appearance: Normal appearance.   Cardiovascular:     Rate and Rhythm: Normal rate.  Pulmonary:     Effort: Pulmonary effort is normal.  Abdominal:     General: There is no distension.   Musculoskeletal:        General: Normal range of motion.   Skin:    General: Skin is warm and dry.   Neurological:     Mental Status: He is alert and oriented to person, place, and time.     Gait: Gait is intact.   Psychiatric:        Mood and Affect: Mood and affect normal.     Recent Labs     Component Value Date/Time   NA 139 07/07/2023 1003   K 5.0  07/07/2023 1003   CL 103 07/07/2023 1003   CO2 21 07/07/2023 1003  GLUCOSE 105 (H) 07/07/2023 1003   GLUCOSE 99 06/18/2022 1012   BUN 21 07/07/2023 1003   CREATININE 1.43 (H) 07/07/2023 1003   CALCIUM  9.6 07/07/2023 1003   PROT 6.4 07/07/2023 1003   ALBUMIN 4.4 07/07/2023 1003   AST 15 07/07/2023 1003   ALT 16 07/07/2023 1003   ALKPHOS 106 07/07/2023 1003   BILITOT 0.2 07/07/2023 1003   GFRNONAA >60 04/07/2016 0600   GFRAA >60 04/07/2016 0600    Lab Results  Component Value Date   WBC 7.5 07/07/2023   HGB 13.5 07/07/2023   HCT 40.3 07/07/2023   MCV 92 07/07/2023   PLT 279 07/07/2023   Lab Results  Component Value Date   HGBA1C 6.6 (A) 07/22/2023   Lab Results  Component Value Date   CHOL 181 07/07/2023   HDL 36 (L) 07/07/2023   LDLCALC 116 (H) 07/07/2023   TRIG 164 (H) 07/07/2023   CHOLHDL 5.0 07/07/2023   Lab Results  Component Value Date   TSH 2.20 05/13/2022     Assessment and Plan:  1. Dizziness (Primary) Patient seems fairly confident it is related to Freon exposure, which is reasonable. Encouraged to wear a mask and fix the leak if there is one. In the meantime, no change to pharmacotherapy for now.  Encouraged home BP monitoring.  Reviewed he can buy a good automatic arm cuff for $35-$50. He says he plans to go to [place] today.     F/u 3 months OV chronic condidions   Rolan Hoyle, PA-C, DMSc, Nutritionist [facility] [phone]

## 2023-08-25 NOTE — Addendum Note (Signed)
 Addended by: MANYA TORIBIO SQUIBB on: 08/25/2023 02:02 PM   Modules accepted: Level of Service

## 2023-09-08 ENCOUNTER — Telehealth: Payer: Self-pay

## 2023-09-08 NOTE — Telephone Encounter (Signed)
 Pre-service center called our office letting us  know that patient authorization with insurance for his MRI has expired. Sent msg to Lauraine Rogue to have authorization re-done for pt MRI tomorrow. Our normal coordinator D. Verdon is out of the office this morning.  - Riley Anderson M.

## 2023-09-09 ENCOUNTER — Ambulatory Visit

## 2023-09-09 ENCOUNTER — Ambulatory Visit
Admission: RE | Admit: 2023-09-09 | Discharge: 2023-09-09 | Disposition: A | Discharge status: Home / Self Care | Source: Ambulatory Visit | Attending: Physician Assistant | Admitting: Physician Assistant

## 2023-09-09 DIAGNOSIS — Q6102 Congenital multiple renal cysts: Secondary | ICD-10-CM | POA: Diagnosis present

## 2023-09-09 DIAGNOSIS — N2889 Other specified disorders of kidney and ureter: Secondary | ICD-10-CM | POA: Insufficient documentation

## 2023-09-09 MED ORDER — GADOBUTROL 1 MMOL/ML IV SOLN
7.5000 mL | Freq: Once | INTRAVENOUS | Status: AC | PRN
  Administered 2023-09-09: 7.5 mL via INTRAVENOUS

## 2023-09-11 ENCOUNTER — Telehealth: Payer: Self-pay

## 2023-09-11 ENCOUNTER — Other Ambulatory Visit: Payer: Self-pay | Admitting: Physician Assistant

## 2023-09-11 ENCOUNTER — Telehealth: Payer: Self-pay | Admitting: Physician Assistant

## 2023-09-11 DIAGNOSIS — N2889 Other specified disorders of kidney and ureter: Secondary | ICD-10-CM

## 2023-09-11 DIAGNOSIS — R1900 Intra-abdominal and pelvic swelling, mass and lump, unspecified site: Secondary | ICD-10-CM

## 2023-09-11 NOTE — Telephone Encounter (Signed)
 Copied from CRM [id number]. Topic: General - Other >> Sep 11, 2023  1:57 PM Cleave MATSU wrote: Reason for CRM: pt returning call to dr manya

## 2023-09-11 NOTE — Telephone Encounter (Signed)
 Spoke with patient at 2:04 today, see my comment on his MRI results.

## 2023-09-11 NOTE — Telephone Encounter (Signed)
 Please review.  KP

## 2023-09-11 NOTE — Telephone Encounter (Signed)
 Attempted call to patient but no answer and voicemail box is full.  Called DPR Rosina Maiden (daughter) and advised to let patient know to call the office back.  Patient to speak with provider only.

## 2023-09-16 ENCOUNTER — Telehealth: Payer: Self-pay

## 2023-09-16 NOTE — Telephone Encounter (Signed)
 Please review.  KP  Copied from CRM [id number]. Topic: Clinical - Lab/Test Results >> Sep 16, 2023 11:37 AM Sophia H wrote: Reason for CRM: Patients daughter Rosina is calling in requesting to speak with PA Toribio regarding the MRI results, states she would like a better understanding to be able to explain it better to the patient. Please contact Rosina # [phone]

## 2023-09-16 NOTE — Telephone Encounter (Signed)
 Spoke with Rosina via phone. 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 Next step is CT Renal Mass Protocol, which is scheduled for Thursday. Rosina says patient seems to be in denial that this could be cancer.

## 2023-09-18 ENCOUNTER — Ambulatory Visit
Admission: RE | Admit: 2023-09-18 | Discharge: 2023-09-18 | Disposition: A | Discharge status: Home / Self Care | Source: Ambulatory Visit | Attending: Physician Assistant | Admitting: Physician Assistant

## 2023-09-18 DIAGNOSIS — R1900 Intra-abdominal and pelvic swelling, mass and lump, unspecified site: Secondary | ICD-10-CM | POA: Insufficient documentation

## 2023-09-18 LAB — POCT I-STAT CREATININE: Creatinine, Ser: 1.6 mg/dL — ABNORMAL HIGH (ref 0.61–1.24)

## 2023-09-18 MED ORDER — IOHEXOL 350 MG/ML SOLN
100.0000 mL | Freq: Once | INTRAVENOUS | Status: AC | PRN
  Administered 2023-09-18: 100 mL via INTRAVENOUS

## 2023-09-22 ENCOUNTER — Telehealth: Payer: Self-pay | Admitting: Physician Assistant

## 2023-09-22 NOTE — Telephone Encounter (Signed)
 Patient returned call. Advised him of Daniel's message. Patient verbalized understanding.

## 2023-09-22 NOTE — Telephone Encounter (Signed)
 Called pt left VM to call back.  KP

## 2023-09-22 NOTE — Telephone Encounter (Signed)
 Copied from CRM [id number]. Topic: Clinical - Lab/Test Results >> Sep 22, 2023  8:18 AM Charlet HERO wrote: Reason for CRM: Patient is clling to have Dr. Manya contact him to go over his imaging with him

## 2023-09-23 NOTE — Telephone Encounter (Signed)
 Noted  KP

## 2023-09-25 ENCOUNTER — Ambulatory Visit: Payer: Self-pay | Admitting: Physician Assistant

## 2023-09-25 ENCOUNTER — Other Ambulatory Visit: Payer: Self-pay | Admitting: Physician Assistant

## 2023-09-25 DIAGNOSIS — N2889 Other specified disorders of kidney and ureter: Secondary | ICD-10-CM | POA: Insufficient documentation

## 2023-09-30 DIAGNOSIS — E1129 Type 2 diabetes mellitus with other diabetic kidney complication: Secondary | ICD-10-CM | POA: Diagnosis not present

## 2023-09-30 DIAGNOSIS — R8290 Unspecified abnormal findings in urine: Secondary | ICD-10-CM | POA: Diagnosis not present

## 2023-09-30 DIAGNOSIS — I10 Essential (primary) hypertension: Secondary | ICD-10-CM | POA: Diagnosis not present

## 2023-09-30 DIAGNOSIS — E785 Hyperlipidemia, unspecified: Secondary | ICD-10-CM | POA: Diagnosis not present

## 2023-09-30 DIAGNOSIS — Z720 Tobacco use: Secondary | ICD-10-CM | POA: Diagnosis not present

## 2023-09-30 DIAGNOSIS — N2889 Other specified disorders of kidney and ureter: Secondary | ICD-10-CM | POA: Diagnosis not present

## 2023-09-30 DIAGNOSIS — N1831 Chronic kidney disease, stage 3a: Secondary | ICD-10-CM | POA: Insufficient documentation

## 2023-10-10 ENCOUNTER — Encounter: Payer: Self-pay | Admitting: Cardiology

## 2023-10-13 ENCOUNTER — Other Ambulatory Visit: Payer: Self-pay | Admitting: Physician Assistant

## 2023-10-13 DIAGNOSIS — R1900 Intra-abdominal and pelvic swelling, mass and lump, unspecified site: Secondary | ICD-10-CM

## 2023-10-13 DIAGNOSIS — N2889 Other specified disorders of kidney and ureter: Secondary | ICD-10-CM

## 2023-11-17 ENCOUNTER — Ambulatory Visit: Payer: Self-pay | Admitting: Physician Assistant

## 2023-11-17 DIAGNOSIS — D3501 Benign neoplasm of right adrenal gland: Secondary | ICD-10-CM | POA: Insufficient documentation

## 2023-11-25 NOTE — Progress Notes (Unsigned)
 11/26/2023 9:25 PM   Riley Anderson Riley Anderson [birth year] [id number]  Referring provider: Douglas Rule, MD [address] [place],  [place] [address]  Urological history: 1. Elevated PSA - PSA (06/2023) 10.2 - prostate MRI (2024) PI-RADS 3 - refused biopsy  2. BPH with LU TS - prostate volume (2024 MRI) 45 cc - tamsulosin  0.4 mg daily   3. Right renal cell carcinoma - CT w/wo (08/2023) right renal lesion measuring 1.5 cm suspicious for renal cell carcinoma - MRI (07//2025)  anterior midportion of the right kidney,measuring 1.4 x 1.1 cm, with a complex internally T2 heterogeneous appearance and strong suspicion for internal contrast enhancement, although very difficult to assess given pervasive motion artifact. This is highly suspicious for a small, enlarging renal cell  carcinoma  4. Right bosniak 18F lesion - CT w/wo (08/2023) 4.4 cm lesion in the lower pole of right kidney - MRI  (08/2023)  inferior pole of the right kidney,  measuring 4.1 x 3.7 cm. Thin enhancing septations described by prior examination are more difficult to appreciate on today's examination secondary to pervasive breath motion artifact. This remains consistent with a Bosniak category IIF cyst.  5. Right renal cysts - CT w/wo (08/2023) multiple cysts w/ largest 4.2 cm   No chief complaint on file.  HPI: Riley Anderson is a 68 y.o. man who presents today for renal cyst.   Previous records reviewed.  I PSS ***  He reports sensation of incomplete bladder emptying, urinary frequency, urinary intermittency, urinary urgency, a weak urinary stream, having to strain to void, nocturia x ***, leaking before being able to reach the restroom, leaking with coughing, leaking without awareness, and post void dribbling.     He is wearing *** pads//depends  daily.    Patient denies any modifying or aggravating factors.  Patient denies any recent UTI's, gross hematuria, dysuria or suprapubic/flank pain.   Patient denies any fevers, chills, nausea or vomiting.  ***  He has a family history of PCa, colon cancer, ovarian cancer and/or breast cancer with ***.   He does not have a family history of PCa, colon cancer, ovarian cancer, and/or breast cancer .***     UA***  PVR***  PSA  ***  Serum creatinine (05//2025) 1.43, eGFR 54  Hemoglobin A1c (06/2023) 6.6  Diuretics:  ***  Fluid consumptiom: ***     PMH: Past Medical History:  Diagnosis Date   Arthritis    Chronic bronchitis (HCC)    Hypertension 02/26/1980    Surgical History: Past Surgical History:  Procedure Laterality Date   DENTAL SURGERY     I & D EXTREMITY Right 04/07/2016   Procedure: IRRIGATION AND DEBRIDEMENT RIGHT INDEX FINGER WITH REPAIRS AND RECONSTRUCTION AS NECESSARY;  Surgeon: Donnice Robinsons, MD;  Location: [facility] OR;  Service: Orthopedics;  Laterality: Right;   TOTAL HIP ARTHROPLASTY Right     Home Medications:  Allergies as of 11/26/2023       Reactions   Other Anaphylaxis   Iron medication that starts with an A   Pseudoephedrine Hcl Other (See Comments)   Unknown   Statins Other (See Comments)   Arthralgia. Has tried at least 3 statins.    Sudafed [pseudoephedrine Hcl]    Unknown   Percocet [oxycodone -acetaminophen ] Rash        Medication List        Accurate as of November 25, 2023  9:25 PM. If you have any questions, ask your nurse or doctor.  albuterol  108 (90 Base) MCG/ACT inhaler Commonly known as: VENTOLIN  HFA Inhale 2 puffs into the lungs every 6 (six) hours as needed for wheezing or shortness of breath.   amLODipine  10 MG tablet Commonly known as: NORVASC  Take 1 tablet (10 mg total) by mouth daily.   [employer] Aspirin Low Dose 81 MG tablet Generic drug: aspirin EC Take 81 mg by mouth 2 (two) times daily.   olmesartan  40 MG tablet Commonly known as: BENICAR  Take 1 tablet (40 mg total) by mouth daily.        Allergies:  Allergies  Allergen Reactions   Other  Anaphylaxis    Iron medication that starts with an A   Pseudoephedrine Hcl Other (See Comments)    Unknown   Statins Other (See Comments)    Arthralgia. Has tried at least 3 statins.    Sudafed [Pseudoephedrine Hcl]     Unknown   Percocet [Oxycodone -Acetaminophen ] Rash    Family History: Family History  Problem Relation Age of Onset   Arthritis Mother    Dementia Mother    Heart disease Father    Hypertension Father    COPD Father    Cancer Father    Melanoma Father    Diabetes Sister    Cancer Sister    Thyroid  cancer Brother     Social History:  reports that he quit smoking about 4 months ago. His smoking use included cigarettes. He started smoking about 51 years ago. He has a 50.3 pack-year smoking history. He has never used smokeless tobacco. He reports that he does not currently use alcohol. He reports that he does not use drugs.  ROS: Pertinent ROS in HPI  Physical Exam: There were no vitals taken for this visit.  Constitutional:  Well nourished. Alert and oriented, No acute distress. HEENT: [place] AT, moist mucus membranes.  Trachea midline, no masses. Cardiovascular: No clubbing, cyanosis, or edema. Respiratory: Normal respiratory effort, no increased work of breathing. GI: Abdomen is soft, non tender, non distended, no abdominal masses. Liver and spleen not palpable.  No hernias appreciated.  Stool sample for occult testing is not indicated.   GU: No CVA tenderness.  No bladder fullness or masses.  Patient with circumcised/uncircumcised phallus. ***Foreskin easily retracted***  Urethral meatus is patent.  No penile discharge. No penile lesions or rashes. Scrotum without lesions, cysts, rashes and/or edema.  Testicles are located scrotally bilaterally. No masses are appreciated in the testicles. Left and right epididymis are normal. Rectal: Patient with  normal sphincter tone. Anus and perineum without scarring or rashes. No rectal masses are appreciated. Prostate is  approximately *** grams, *** nodules are appreciated. Seminal vesicles are normal. Skin: No rashes, bruises or suspicious lesions. Lymph: No cervical or inguinal adenopathy. Neurologic: Grossly intact, no focal deficits, moving all 4 extremities. Psychiatric: Normal mood and affect.  Laboratory Data: Lab Results  Component Value Date   WBC 7.5 07/07/2023   HGB 13.5 07/07/2023   HCT 40.3 07/07/2023   MCV 92 07/07/2023   PLT 279 07/07/2023   Lab Results  Component Value Date   CREATININE 1.60 (H) 09/18/2023   Lab Results  Component Value Date   HGBA1C 6.6 (A) 07/22/2023  I have reviewed the labs.  See HPI    Pertinent Imaging: ******** ADDENDUM #1 ******** EXAM: CT CHEST, ABDOMEN AND PELVIS WITH AND WITHOUT CONTRAST 09/18/2023 08:20:58 AM   TECHNIQUE: CT of the chest, abdomen and pelvis was performed with and without the administration of intravenous contrast. Multiplanar  reformatted images are provided for review. Automated exposure control, iterative reconstruction, and/or weight based adjustment of the mA/kV was utilized to reduce the radiation dose to as low as reasonably achievable.   COMPARISON: None available.   CLINICAL HISTORY: Abdominal mass, intra-abdominal neoplasm suspected; RENAL MASS PROTOCOL ** Per [facility]/CT. MF. **RENAL MASS PROTOCOL** ; Recent renal MRI follow up on Bosniak cyst revealed rapid growth of right renal lesion highly suspicious for renal cell carcinoma with recommendation for contrast CT for further characterization.   FINDINGS:   CHEST:   MEDIASTINUM: Heart and pericardium are unremarkable. The central airways are clear.   THORACIC LYMPH NODES: No mediastinal, hilar or axillary lymphadenopathy.   LUNGS AND PLEURA: No focal consolidation or pulmonary edema. No pleural effusion or pneumothorax.   ABDOMEN AND PELVIS:   LIVER: Multiple cysts are noted within the liver. The largest is in the right lobe measuring 5 cm,  image 43/6.   GALLBLADDER AND BILE DUCTS: Gallbladder is normal. No bile duct dilatation.   SPLEEN: No acute abnormality.   PANCREAS: No acute abnormality.   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 No follow-up imaging recommended.   KIDNEYS, URETERS AND BLADDER: Multiple simple-appearing right kidney cysts are noted. The largest is off the upper pole of the right kidney, measuring 4.2 cm. Image 17/16. Previous Bosniak class 56f lesion within the lower pole of the right kidney measures 4.4 cm. On the pre-contrast images, this measures 60 Hounsfield units. On the arterial phase images this measures 65 Hounsfield units on the delayed nephrographic phase images, this measured 70.1 Hounsfield units, and on the delayed phase images 66 Hounsfield units. No left kidney lesions. No signs of obstructive uropathy. Partially decompressed bladder is obscured by streak artifact from right hip arthroplasty device.   GI AND BOWEL: The appendix is visualized and appears normal. Scattered colonic diverticula.   REPRODUCTIVE ORGANS: Prostate gland enlargement with coarse calcification.   PERITONEUM AND RETROPERITONEUM: No ascites. No free air. Retrocaval lymph node measures 1 cm.   VASCULATURE: Azygous continuation of the IVC noted. Extensive aortic atherosclerotic calcifications.   ABDOMINAL AND PELVIS LYMPH NODES: No lymphadenopathy.   REPRODUCTIVE ORGANS: No acute abnormality.   BONES AND SOFT TISSUES: Small fat-containing right inguinal hernia and small fat-containing umbilical hernia status post right hip arthroplasty. Lumbar spondylosis. 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   IMPRESSION: 1. Right renal lesion, suspicious for renal cell carcinoma, measuring 1.5 cm with enhancement characteristics as described above. 2. Multiple simple-appearing right kidney cysts, the largest measuring 4.2 cm. No  follow-up 3. Bosniak class 2f lesion within the lower pole of the right kidney, measuring 4.4 cm with enhancement characteristics as described above. Follow up imaging in 12 months   NOTE: This was not performed as a CTA and therefore MIP, 3D, volume or shade surface rendering were not performed.   Electronically signed by: Waddell Calk MD 11/13/2023 11:25 AM EDT RP Workstation: [id number]    Addended by Calk Waddell, MD on 11/17/2023  7:22 AM  CLINICAL DATA:  Follow-up Bosniak category IIF cyst   EXAM: MRI ABDOMEN WITHOUT AND WITH CONTRAST   TECHNIQUE: Multiplanar multisequence MR imaging of the abdomen was performed both before and after the administration of intravenous contrast.   CONTRAST:  7.5mL GADAVIST  GADOBUTROL  1 MMOL/ML IV SOLN   COMPARISON:  01/16/2023   FINDINGS: Today's examination is significantly limited by breath motion artifact throughout, particularly degrading multiphasic contrast enhanced sequences.  Lower chest: No acute abnormality.   Hepatobiliary: No solid liver abnormality is seen. No gallstones, gallbladder wall thickening, or biliary dilatation.   Pancreas: Unremarkable. No pancreatic ductal dilatation or surrounding inflammatory changes.   Spleen: Normal in size without significant abnormality.   Adrenals/Urinary Tract: Adrenal glands are unremarkable. Within exam limitations, no significant change in a hemorrhagic or proteinaceous cyst of the inferior pole of the right kidney, measuring 4.1 x 3.7 cm (series 4, image 27). Thin enhancing septations are more difficult to appreciate on today's examination secondary to pervasive breath motion artifact. Significant interval enlargement of a previously simple appearing lesion in the anterior midportion of the right kidney, measuring 1.4 x 1.1 cm, with a complex internally T2 heterogeneous appearance and strong suspicion for internal contrast enhancement on today's examination, although  very difficult to assess given pervasive motion artifact (series 4, image 22). Additional simple benign renal cortical cysts. Left kidney is normal, without obvious renal calculi, solid lesion, or hydronephrosis.   Stomach/Bowel: Stomach is within normal limits. No evidence of bowel wall thickening, distention, or inflammatory changes.   Vascular/Lymphatic: Aortic atherosclerosis. No enlarged abdominal lymph nodes.   Other: No abdominal wall hernia or abnormality. No ascites.   Musculoskeletal: No acute or significant osseous findings.   IMPRESSION: 1. Today's examination is significantly limited by breath motion artifact throughout, particularly degrading multiphasic contrast enhanced sequences. 2. Within exam limitations, no significant change in a hemorrhagic or proteinaceous cyst of the inferior pole of the right kidney, measuring 4.1 x 3.7 cm. Thin enhancing septations described by prior examination are more difficult to appreciate on today's examination secondary to pervasive breath motion artifact. This remains consistent with a Bosniak category IIF cyst. Recommend continued attention on follow-up. 3. Significant interval enlargement of a previously tiny and simple appearing lesion in the anterior midportion of the right kidney, measuring 1.4 x 1.1 cm, with a complex internally T2 heterogeneous appearance and strong suspicion for internal contrast enhancement, although very difficult to assess given pervasive motion artifact. This is highly suspicious for a small, enlarging renal cell carcinoma. Given very significant technical limitations on today's MR, consider multiphasic contrast enhanced CT for better characterization of this lesion, as this modality is much more robust to breath motion artifact. 4. No evidence of lymphadenopathy or metastatic disease in the abdomen.   Aortic Atherosclerosis (ICD10-I70.0).     Electronically Signed   By: Marolyn JONETTA Jaksch M.D.    On: 09/11/2023 12:57 I have independently reviewed the films.  See HPI.    Assessment & Plan:  ***  1. Right renal lesion suspicious for renal cell carcinoma - discussed that a mass at this size there is a high likelihood of being malignant  - recommendations for definitive treatment are partial nephrectomy, thermal ablation and continued AS - dicussed that partial nephrectomy is the Gold standard and advised a consultation with Dr. Georganne - dicussed that thermal ablation which has a higher local recurrence rates compared to surgical excision - AS would not be a primary recommendation   2. Right Bosniak 47F - explained that this lesion may continued to be monitored with follow up imaging at 6-12 months intervals and then annually for 2-3 years if stable   3. Elevated PSA - based on PSA levels and MRI findings, there is ~ 30% of having prostate cancer at this time - patient is still adamant about not pursuing a prostate biopsy   4. BPH with LU TS - stable, improving, worsening mild, moderate severe symptoms *** -  no signs of retention, infection or malignancy *** - PSA up to date  - DRE benign *** - UA benign *** - PVR < 300 cc *** - most bothersome symptoms are *** - encouraged avoiding bladder irritants, fluid restriction before bedtime and timed voiding's - Initiate alpha-blocker (***), discussed side effects *** - Initiate 5 alpha reductase inhibitor (***), discussed side effects *** - Continue tamsulosin  0.4 mg daily; refills given - Cannot tolerate medication or medication failure, schedule cystoscopy *** - educated on red flag symptoms: acute retention, gross hematuria, fever, severe pain - advised to call clinic or go to the ED if these occur - return to clinic in *** symptom re-evaluation ***     No follow-ups on file.  These notes generated with voice recognition software. I apologize for typographical errors.  Riley Anderson  [facility] [address]  [address] [place], [place] [address] [phone]

## 2023-11-26 ENCOUNTER — Encounter: Payer: Self-pay | Admitting: Urology

## 2023-11-26 ENCOUNTER — Ambulatory Visit (INDEPENDENT_AMBULATORY_CARE_PROVIDER_SITE_OTHER): Admitting: Urology

## 2023-11-26 VITALS — BP 153/94 | HR 94 | Ht 66.0 in | Wt 190.6 lb

## 2023-11-26 DIAGNOSIS — N138 Other obstructive and reflux uropathy: Secondary | ICD-10-CM

## 2023-11-26 DIAGNOSIS — R972 Elevated prostate specific antigen [PSA]: Secondary | ICD-10-CM | POA: Diagnosis not present

## 2023-11-26 DIAGNOSIS — N2889 Other specified disorders of kidney and ureter: Secondary | ICD-10-CM | POA: Diagnosis not present

## 2023-11-26 DIAGNOSIS — N281 Cyst of kidney, acquired: Secondary | ICD-10-CM | POA: Diagnosis not present

## 2023-11-26 DIAGNOSIS — N401 Enlarged prostate with lower urinary tract symptoms: Secondary | ICD-10-CM

## 2023-11-26 LAB — BLADDER SCAN AMB NON-IMAGING

## 2023-11-27 ENCOUNTER — Ambulatory Visit: Admitting: Physician Assistant

## 2023-12-08 ENCOUNTER — Encounter: Payer: Self-pay | Admitting: Physician Assistant

## 2023-12-08 ENCOUNTER — Other Ambulatory Visit: Payer: Self-pay | Admitting: Physician Assistant

## 2023-12-08 ENCOUNTER — Ambulatory Visit (INDEPENDENT_AMBULATORY_CARE_PROVIDER_SITE_OTHER): Admitting: Physician Assistant

## 2023-12-08 VITALS — BP 122/88 | HR 84 | Temp 97.9°F | Ht 64.0 in | Wt 193.0 lb

## 2023-12-08 DIAGNOSIS — N2889 Other specified disorders of kidney and ureter: Secondary | ICD-10-CM

## 2023-12-08 DIAGNOSIS — R972 Elevated prostate specific antigen [PSA]: Secondary | ICD-10-CM | POA: Diagnosis not present

## 2023-12-08 DIAGNOSIS — N1831 Chronic kidney disease, stage 3a: Secondary | ICD-10-CM

## 2023-12-08 DIAGNOSIS — E1129 Type 2 diabetes mellitus with other diabetic kidney complication: Secondary | ICD-10-CM | POA: Diagnosis not present

## 2023-12-08 DIAGNOSIS — N400 Enlarged prostate without lower urinary tract symptoms: Secondary | ICD-10-CM | POA: Insufficient documentation

## 2023-12-08 DIAGNOSIS — M1611 Unilateral primary osteoarthritis, right hip: Secondary | ICD-10-CM | POA: Insufficient documentation

## 2023-12-08 DIAGNOSIS — R809 Proteinuria, unspecified: Secondary | ICD-10-CM | POA: Diagnosis not present

## 2023-12-08 LAB — POCT GLYCOSYLATED HEMOGLOBIN (HGB A1C): Hemoglobin A1C: 6.5 % — AB (ref 4.0–5.6)

## 2023-12-08 NOTE — Progress Notes (Signed)
 Date:  12/08/2023   Name:  Riley Anderson   DOB:  [birth year]   MRN:  [id number]   Chief Complaint: Medical Management of Chronic Issues and Hyperlipidemia (Medication cause swelling, pravastatin , not taking for 1 year )  HPI  Cote presents today for routine follow-up on chronic conditions, due for some updated labs.  History of diet-controlled T2DM, HLD, HTN.  More recently as healthcare has been focused around probable right renal cancer presently being evaluated by [facility] with f/u 12/18/23 to discuss options for intervention. Patient is in denial of this concerning lesion, says it's just a renal cyst. I have previously discussed with him that he has both benign renal cysts and a concerning renal lesion. He is perplexed why his healtcare providers are concerned about this since he feels well. He has also been recommended for prostate biopsy but adamantly declines.   Per his most recent urology note 11/26/23:  It had been recommended that he undergo a prostate biopsy by Dr. Twylla back in March after having a PSA of 9.18, a PI-RADS 3 lesion on prostate MRI and a prostate volume of 45 cc.  He continues to refuse a biopsy.  Unfortunately, he has friends and family members who had bad outcomes with biopsies and cancer treatments which makes him very anxious and hesitant to pursue any biopsy.     He had his initial MRI back in November 2024 through his primary care provider which noted Bosniak 51F cysts.  On his follow-up MRI there was significant interval enlargement of the lesion in the right kidney, which is now measuring 1.4 cm was very suspicious for renal cell carcinoma.  The radiologist recommended a CT with without contrast for further evaluation which also shows the 1.5 cm lesion concerning for renal cell carcinoma.  He also has a 51F lesion in the lower pole of the right kidney.   Medication list has been reviewed and updated.  Current Meds  Medication Sig    albuterol  (VENTOLIN  HFA) 108 (90 Base) MCG/ACT inhaler Inhale 2 puffs into the lungs every 6 (six) hours as needed for wheezing or shortness of breath.   amLODipine  (NORVASC ) 10 MG tablet Take 1 tablet (10 mg total) by mouth daily.   olmesartan -hydrochlorothiazide (BENICAR  HCT) 40-25 MG tablet Take 1 tablet by mouth daily.   oxyCODONE  (OXY IR/ROXICODONE ) 5 MG immediate release tablet TAKE 1 TABLET BY MOUTH EVERY 4 HOURS   [DISCONTINUED] [facility] ASPIRIN LOW DOSE 81 MG tablet Take 81 mg by mouth 2 (two) times daily.   [DISCONTINUED] pravastatin  (PRAVACHOL ) 20 MG tablet Take 10 mg by mouth daily.     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, without long-term current use of insulin (HCC) 01/10/2023   Aortic atherosclerosis 01/09/2023   Multiple renal cysts 01/09/2023   Hepatic cyst 01/09/2023   Anxiety 01/09/2023   Chronic pain of right hip 12/12/2022   Closed fracture of distal end of radius 09/10/2022   Mixed hyperlipidemia 08/21/2022   Low back pain radiating to left lower extremity 07/24/2022   Degenerative arthritis of  lumbar spine 07/24/2022   Spondylosis of lumbar spine 07/24/2022   Osteoarthritis, multiple sites 07/24/2022   Elevated PSA 06/18/2022   GAD (generalized anxiety disorder) 06/18/2022   Primary hypertension 05/13/2022   Tobacco use disorder 05/13/2022   Emphysema/COPD 05/13/2022   Overweight 05/13/2022    Allergies  Allergen Reactions   Other Anaphylaxis and Other (See Comments)    Iron medication that starts with an A    Oxycodone  Dermatitis   Oxycodone -Acetaminophen  Dermatitis   Pseudoephedrine Hcl Other (See Comments)    Unknown   Statins Other (See Comments)    Arthralgia. Has tried at least 3 statins.    Sudafed [Pseudoephedrine Hcl]     Unknown   Percocet [Oxycodone -Acetaminophen ] Rash    Immunization History  Administered Date(s) Administered   Tdap 04/07/2016    Past Surgical History:  Procedure Laterality Date   DENTAL SURGERY     I & D EXTREMITY Right 04/07/2016   Procedure: IRRIGATION AND DEBRIDEMENT RIGHT INDEX FINGER WITH REPAIRS AND RECONSTRUCTION AS NECESSARY;  Surgeon: Donnice Robinsons, MD;  Location: [facility] OR;  Service: Orthopedics;  Laterality: Right;   TOTAL HIP ARTHROPLASTY Right     Social History   Tobacco Use   Smoking status: Former    Current packs/day: 0.00    Average packs/day: 1 pack/day for 50.7 years (50.3 ttl pk-yrs)    Types: Cigarettes    Start date: 89    Quit date: 07/01/2023    Years since quitting: 0.4   Smokeless tobacco: Never  Vaping Use   Vaping status: Never Used  Substance Use Topics   Alcohol use: Not Currently   Drug use: No    Family History  Problem Relation Age of Onset   Arthritis Mother    Dementia Mother    Heart disease Father    Hypertension Father    COPD Father    Cancer Father    Melanoma Father    Diabetes Sister    Cancer Sister    Thyroid  cancer Brother         12/08/2023    9:31 AM 08/25/2023    9:22 AM 07/22/2023    8:37 AM 04/25/2023    1:19 PM  GAD 7 : Generalized Anxiety Score  Nervous, Anxious, on Edge 0 0 0 0  Control/stop worrying 0 0 0 0  Worry too much - different things 0 0 0 0  Trouble relaxing 0 0 0 0  Restless 0 0 0 0  Easily annoyed or irritable 0 0 0 0  Afraid - awful might happen 0 0 0 0  Total GAD 7 Score 0 0 0 0  Anxiety Difficulty Not difficult at all Not difficult at all Not difficult at all Not difficult at all       12/08/2023    9:31 AM 08/25/2023    9:22 AM 07/22/2023    8:37 AM   Depression screen PHQ 2/9  Decreased Interest 0 0 0  Down, Depressed, Hopeless 0 0 0  PHQ - 2 Score 0 0 0    BP Readings from Last 3 Encounters:  12/08/23 122/88  11/26/23 (!) 153/94  08/25/23 124/82    Wt Readings from Last 3 Encounters:  12/08/23 193 lb (87.5 kg)  11/26/23 190 lb 9.6 oz (86.5 kg)  08/25/23 188 lb (85.3 kg)    BP 122/88   Pulse 84   Temp 97.9 F (36.6 C)   Ht 5' 4 (1.626 m)   Wt 193 lb (87.5  kg)   SpO2 96%   BMI 33.13 kg/m   Physical Exam Vitals and nursing note reviewed.  Constitutional:      Appearance: Normal appearance.  Cardiovascular:     Rate and Rhythm: Normal rate and regular rhythm.     Heart sounds: No murmur heard.    No friction rub. No gallop.  Pulmonary:     Effort: Pulmonary effort is normal.  Abdominal:     General: There is no distension.  Musculoskeletal:        General: Normal range of motion.  Skin:    General: Skin is warm and dry.  Neurological:     Mental Status: He is alert and oriented to person, place, and time.     Gait: Gait is intact.  Psychiatric:        Mood and Affect: Mood and affect normal.     Recent Labs     Component Value Date/Time   NA 139 07/07/2023 1003   K 5.0 07/07/2023 1003   CL 103 07/07/2023 1003   CO2 21 07/07/2023 1003   GLUCOSE 105 (H) 07/07/2023 1003   GLUCOSE 99 06/18/2022 1012   BUN 21 07/07/2023 1003   CREATININE 1.60 (H) 09/18/2023 0809   CALCIUM  9.6 07/07/2023 1003   PROT 6.4 07/07/2023 1003   ALBUMIN 4.4 07/07/2023 1003   AST 15 07/07/2023 1003   ALT 16 07/07/2023 1003   ALKPHOS 106 07/07/2023 1003   BILITOT 0.2 07/07/2023 1003   GFRNONAA >60 04/07/2016 0600   GFRAA >60 04/07/2016 0600    Lab Results  Component Value Date   WBC 7.5 07/07/2023   HGB 13.5 07/07/2023   HCT 40.3 07/07/2023   MCV 92 07/07/2023   PLT 279 07/07/2023   Lab Results  Component Value Date   HGBA1C 6.5 (A) 12/08/2023   HGBA1C 6.6 (A) 07/22/2023   HGBA1C 6.3 (A) 04/25/2023   Lab  Results  Component Value Date   CHOL 181 07/07/2023   HDL 36 (L) 07/07/2023   LDLCALC 116 (H) 07/07/2023   TRIG 164 (H) 07/07/2023   CHOLHDL 5.0 07/07/2023   Lab Results  Component Value Date   TSH 2.20 05/13/2022      Assessment and Plan:  Renal mass, right Assessment & Plan: Patient requesting referral to [facility] for second opinion.  I have advised him to keep his appointment with the [facility] on 12/18/2023 as well.  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  Recommend diet low in simple carbohydrates such as white starches (bread, pasta, rice) and refined sugar found in desserts and sweetened beverages including juice, sweet tea, and soda.    Orders: -     POCT glycosylated hemoglobin (Hb A1C) -     Basic metabolic panel with GFR -     CBC with Differential/Platelet -     Lipid panel  Stage 3a chronic kidney disease (HCC) Assessment & Plan: Due for repeat BMP to assess stability  Orders: -     Basic metabolic panel with GFR -     CBC with Differential/Platelet     Return in about 4 months (around 04/09/2024) for OV f/u chronic conditions.  Rolan Hoyle, PA-C, DMSc, Nutritionist [facility] [phone]

## 2023-12-08 NOTE — Assessment & Plan Note (Signed)
 Patient requesting referral to [facility] for second opinion.  I have advised him to keep his appointment with the [facility] on 12/18/2023 as well.

## 2023-12-08 NOTE — Assessment & Plan Note (Signed)
 Due for repeat BMP to assess stability

## 2023-12-08 NOTE — Assessment & Plan Note (Signed)
Repeat PSA 

## 2023-12-08 NOTE — Assessment & Plan Note (Signed)
 A1c 6.5% today reflecting ongoing glycemic control without medications.  Recommend diet low in simple carbohydrates such as white starches (bread, pasta, rice) and refined sugar found in desserts and sweetened beverages including juice, sweet tea, and soda.

## 2023-12-09 LAB — CBC WITH DIFFERENTIAL/PLATELET
Basophils Absolute: 0.1 x10E3/uL (ref 0.0–0.2)
Basos: 1 %
EOS (ABSOLUTE): 0.3 x10E3/uL (ref 0.0–0.4)
Eos: 3 %
Hematocrit: 43.4 % (ref 37.5–51.0)
Hemoglobin: 13.8 g/dL (ref 13.0–17.7)
Immature Grans (Abs): 0.1 x10E3/uL (ref 0.0–0.1)
Immature Granulocytes: 1 %
Lymphocytes Absolute: 1.6 x10E3/uL (ref 0.7–3.1)
Lymphs: 20 %
MCH: 28.8 pg (ref 26.6–33.0)
MCHC: 31.8 g/dL (ref 31.5–35.7)
MCV: 91 fL (ref 79–97)
Monocytes Absolute: 0.9 x10E3/uL (ref 0.1–0.9)
Monocytes: 11 %
Neutrophils Absolute: 5 x10E3/uL (ref 1.4–7.0)
Neutrophils: 64 %
Platelets: 296 x10E3/uL (ref 150–450)
RBC: 4.79 x10E6/uL (ref 4.14–5.80)
RDW: 13.9 % (ref 11.6–15.4)
WBC: 7.8 x10E3/uL (ref 3.4–10.8)

## 2023-12-09 LAB — BASIC METABOLIC PANEL WITH GFR
BUN/Creatinine Ratio: 12 (ref 10–24)
BUN: 17 mg/dL (ref 8–27)
CO2: 21 mmol/L (ref 20–29)
Calcium: 9.5 mg/dL (ref 8.6–10.2)
Chloride: 106 mmol/L (ref 96–106)
Creatinine, Ser: 1.43 mg/dL — ABNORMAL HIGH (ref 0.76–1.27)
Glucose: 113 mg/dL — ABNORMAL HIGH (ref 70–99)
Potassium: 5 mmol/L (ref 3.5–5.2)
Sodium: 143 mmol/L (ref 134–144)
eGFR: 54 mL/min/1.73 — ABNORMAL LOW (ref >59)

## 2023-12-09 LAB — LIPID PANEL
Chol/HDL Ratio: 6.5 ratio — ABNORMAL HIGH (ref 0.0–5.0)
Cholesterol, Total: 228 mg/dL — ABNORMAL HIGH (ref 100–199)
HDL: 35 mg/dL — ABNORMAL LOW (ref >39)
LDL Chol Calc (NIH): 144 mg/dL — ABNORMAL HIGH (ref 0–99)
Triglycerides: 269 mg/dL — ABNORMAL HIGH (ref 0–149)
VLDL Cholesterol Cal: 49 mg/dL — ABNORMAL HIGH (ref 5–40)

## 2023-12-09 LAB — PSA: Prostate Specific Ag, Serum: 9.3 ng/mL — ABNORMAL HIGH (ref 0.0–4.0)

## 2023-12-10 ENCOUNTER — Ambulatory Visit: Payer: Self-pay | Admitting: Physician Assistant

## 2023-12-10 ENCOUNTER — Telehealth: Payer: Self-pay

## 2023-12-10 NOTE — Telephone Encounter (Signed)
 Copied from CRM [id number]. Topic: Clinical - Lab/Test Results >> Dec 09, 2023  4:13 PM Winona R wrote: Pt has questions about his labs results .

## 2023-12-10 NOTE — Telephone Encounter (Signed)
 Rolan has not viewed labs. Will call or send a message on Mychart when he views labs.  KP

## 2023-12-12 ENCOUNTER — Telehealth: Payer: Self-pay | Admitting: Physician Assistant

## 2023-12-12 NOTE — Telephone Encounter (Signed)
 Copied from CRM [id number]. Topic: Referral - Question >> Dec 12, 2023  9:20 AM Lonell PEDLAR wrote: Reason for CRM: Patient would like referral to be sent to [facility] hospital rather than [facility], states [place] is [place] with insurance

## 2023-12-12 NOTE — Telephone Encounter (Signed)
 Please send referral to [facility] instead of [facility].  KP

## 2023-12-12 NOTE — Telephone Encounter (Signed)
 Copied from CRM [id number]. Topic: Referral - Question >> Dec 12, 2023  9:20 AM Lonell PEDLAR wrote: Reason for CRM: Patient would like referral to be sent to [facility] hospital rather than [facility], states [place] is [place] with insurance >> Dec 12, 2023 11:24 AM Treva T wrote: Patient calling states he received a text with a referral to [facility], patient states all referrals need to go to [facility] as [facility] is out of network with [employer].   Can be reached at [phone], if need to discuss further.

## 2023-12-12 NOTE — Telephone Encounter (Signed)
 Sent pt a Mychart message.   KP

## 2023-12-14 NOTE — Assessment & Plan Note (Signed)
 1.5 cm enhancing Right renal mass  Endophytic, ant. Interpolar  Indeterminate 1cm retrocaval node 4.5 cm RLP Bosniak 41F cyst  I reviewed his recent MRI and CT imaging. He appears to have a new enhancing right interpolar lesion, although admittedly challenging to fully characterize at this size. The lesion is new from 2024, and appears to have objective enhancement on CT imaging.   - interval CT renal protocol in ~4-6 months

## 2023-12-14 NOTE — Progress Notes (Unsigned)
   12/18/2023 6:58 PM   Christopher LITTIE Devonshire 06-29-1955 [id number]  Reason for visit: Follow up renal cysts   HPI: 68 y.o. male, initial follow up with me today, previously seen by PA McGowan in Oct 2025   Prior HPI: Renal cysts - MRI 2024 - Bosniak IIF Right renal cysts - MRI (July 2025) - 4.1 cm RLP Bosniak IIF Cyst, 1.4 cm Right interpolar complex cyst Bosniak III/IV - CT A/P (Sept 2025) -   Elevated PSA - PSA (06/2023) 10.2 - prostate MRI (2024) PI-RADS 3 - refused biopsy  BPH with LU TS - prostate volume (2024 MRI) 45 cc - tamsulosin  0.4 mg daily    Physical Exam: There were no vitals taken for this visit.   Constitutional:  Alert and oriented, No acute distress.  Laboratory Data:  Latest Reference Range & Units 12/08/23 10:13  Creatinine 0.76 - 1.27 mg/dL 8.56 (H)     Latest Reference Range & Units 05/13/22 08:38 06/18/22 10:12 01/09/23 16:38 07/07/23 10:03 12/08/23 10:13  PSA 0.10 - 4.00 ng/mL 6.98 (H) 9.18 (H)     Prostate Specific Ag, Serum 0.0 - 4.0 ng/mL   10.8 (H) 10.2 (H) 9.3 (H)  (H): Data is abnormally high  Pertinent Imaging: I have personally viewed and interpreted the MRI and CT A/P (11/13/23) - agree with read with multiple small Right simple renal cysts. 4.5 cm RLP Bosniak IIF cyst, appears to have weak ~10-15HU enhancement, possible internal proteinaceous debris. 1.5 cm Right anterior interpolar, fully endophytic enhancing lesion (13HU --> 51 HU) in delayed phased (although ROI quite small and some degree of possible artifact). There is a 1cm retrocaval LN just superior to right renal vein takeoff.   I do not see the 1.5cm enhancing lesion on prior MRI from 2024    Assessment & Plan:    Renal mass, right Assessment & Plan: 1.5 cm enhancing Right renal mass  Endophytic, ant. Interpolar  Indeterminate 1cm retrocaval node  I reviewed his recent MRI and CT imaging. He appears to have a new enhancing right interpolar lesion, although admittedly  challenging to fully characterize at this size. The lesion is new from 2024, and appears to have objective enhancement on CT imaging.   - interval CT renal protocol in ~4-6 months        Penne JONELLE Skye, MD  [facility] [address], [address] [place], [place] [address] [phone]

## 2023-12-15 ENCOUNTER — Other Ambulatory Visit: Payer: Self-pay | Admitting: Physician Assistant

## 2023-12-15 DIAGNOSIS — E782 Mixed hyperlipidemia: Secondary | ICD-10-CM

## 2023-12-15 MED ORDER — EZETIMIBE 10 MG PO TABS: 10.0000 mg | ORAL_TABLET | Freq: Every day | ORAL | 1 refills | Status: DC

## 2023-12-15 NOTE — Progress Notes (Signed)
 Done

## 2023-12-18 ENCOUNTER — Encounter: Payer: Self-pay | Admitting: Urology

## 2023-12-18 ENCOUNTER — Ambulatory Visit (INDEPENDENT_AMBULATORY_CARE_PROVIDER_SITE_OTHER): Admitting: Urology

## 2023-12-18 DIAGNOSIS — R972 Elevated prostate specific antigen [PSA]: Secondary | ICD-10-CM | POA: Diagnosis not present

## 2023-12-18 DIAGNOSIS — N2889 Other specified disorders of kidney and ureter: Secondary | ICD-10-CM

## 2023-12-18 DIAGNOSIS — Z720 Tobacco use: Secondary | ICD-10-CM | POA: Diagnosis not present

## 2023-12-18 DIAGNOSIS — N1831 Chronic kidney disease, stage 3a: Secondary | ICD-10-CM | POA: Diagnosis not present

## 2023-12-18 DIAGNOSIS — I10 Essential (primary) hypertension: Secondary | ICD-10-CM | POA: Diagnosis not present

## 2023-12-18 DIAGNOSIS — E1129 Type 2 diabetes mellitus with other diabetic kidney complication: Secondary | ICD-10-CM | POA: Diagnosis not present

## 2023-12-18 DIAGNOSIS — E785 Hyperlipidemia, unspecified: Secondary | ICD-10-CM | POA: Diagnosis not present

## 2023-12-18 NOTE — Patient Instructions (Signed)
 Please contact Central Scheduling to set up your CT at [phone].

## 2023-12-18 NOTE — Addendum Note (Signed)
 Addended byBETHA CORIE PLATER on: 12/18/2023 02:26 PM   Modules accepted: Orders

## 2023-12-18 NOTE — Assessment & Plan Note (Addendum)
 PSA 6 - 10.8 (since 2024)  - prostate MRI (2024) PI-RADS 3 - 45g gland, PSAD 0.21 - refused biopsy  Last PSA 9.3 (Oct 2025)   Reviewed his PSA history and prior MRI imaging.  While his prior MRI from 2024 was reassuring, he maintains an elevated PSA and high PSA density.  I would recommend a prostate biopsy for definitive diagnosis, or at minimum an interval MRI to recharacterize any suspicious lesions.  He was adamantly against any further workup for his prostate.  He understood the risk of missed diagnosis of prostate cancer.  At the end of our visit, he had at least agreed with me if his PSA reaches 11 he will reconsider.   - PSA in 1 year

## 2023-12-19 ENCOUNTER — Ambulatory Visit
Admission: RE | Admit: 2023-12-19 | Discharge: 2023-12-19 | Disposition: A | Discharge status: Home / Self Care | Source: Ambulatory Visit | Attending: Acute Care | Admitting: Acute Care

## 2023-12-19 DIAGNOSIS — Z122 Encounter for screening for malignant neoplasm of respiratory organs: Secondary | ICD-10-CM | POA: Insufficient documentation

## 2023-12-19 DIAGNOSIS — Z87891 Personal history of nicotine dependence: Secondary | ICD-10-CM | POA: Insufficient documentation

## 2023-12-19 DIAGNOSIS — F17210 Nicotine dependence, cigarettes, uncomplicated: Secondary | ICD-10-CM | POA: Insufficient documentation

## 2023-12-26 ENCOUNTER — Other Ambulatory Visit: Payer: Self-pay

## 2023-12-26 DIAGNOSIS — Z87891 Personal history of nicotine dependence: Secondary | ICD-10-CM

## 2023-12-26 DIAGNOSIS — F17210 Nicotine dependence, cigarettes, uncomplicated: Secondary | ICD-10-CM

## 2023-12-26 DIAGNOSIS — Z122 Encounter for screening for malignant neoplasm of respiratory organs: Secondary | ICD-10-CM

## 2023-12-31 DIAGNOSIS — N2889 Other specified disorders of kidney and ureter: Secondary | ICD-10-CM | POA: Diagnosis not present

## 2024-01-11 NOTE — Progress Notes (Signed)
 Pharmacy Quality Measure Review  This patient is appearing on a report for being at risk of failing the adherence measure for hypertension (ACEi/ARB) medications this calendar year.   Medication: olmesartan  40 mg Last fill date: 12/23/23 for 90 day supply  Insurance report was not up to date. No action needed at this time.   Jenkins Graces, PharmD PGY1 Pharmacy Resident [phone]

## 2024-01-26 DIAGNOSIS — R809 Proteinuria, unspecified: Secondary | ICD-10-CM | POA: Diagnosis not present

## 2024-01-26 DIAGNOSIS — E785 Hyperlipidemia, unspecified: Secondary | ICD-10-CM | POA: Diagnosis not present

## 2024-01-26 DIAGNOSIS — E1129 Type 2 diabetes mellitus with other diabetic kidney complication: Secondary | ICD-10-CM | POA: Diagnosis not present

## 2024-01-26 DIAGNOSIS — I10 Essential (primary) hypertension: Secondary | ICD-10-CM | POA: Diagnosis not present

## 2024-01-26 DIAGNOSIS — N1831 Chronic kidney disease, stage 3a: Secondary | ICD-10-CM | POA: Diagnosis not present

## 2024-01-28 DIAGNOSIS — R809 Proteinuria, unspecified: Secondary | ICD-10-CM | POA: Diagnosis not present

## 2024-01-28 DIAGNOSIS — N1831 Chronic kidney disease, stage 3a: Secondary | ICD-10-CM | POA: Diagnosis not present

## 2024-01-28 DIAGNOSIS — E785 Hyperlipidemia, unspecified: Secondary | ICD-10-CM | POA: Diagnosis not present

## 2024-01-28 DIAGNOSIS — I10 Essential (primary) hypertension: Secondary | ICD-10-CM | POA: Diagnosis not present

## 2024-01-28 DIAGNOSIS — E1129 Type 2 diabetes mellitus with other diabetic kidney complication: Secondary | ICD-10-CM | POA: Diagnosis not present

## 2024-03-08 ENCOUNTER — Ambulatory Visit
Admission: RE | Admit: 2024-03-08 | Discharge: 2024-03-08 | Disposition: A | Discharge status: Home / Self Care | Source: Ambulatory Visit | Attending: Urology | Admitting: Urology

## 2024-03-08 DIAGNOSIS — N2889 Other specified disorders of kidney and ureter: Secondary | ICD-10-CM | POA: Diagnosis present

## 2024-03-08 DIAGNOSIS — R972 Elevated prostate specific antigen [PSA]: Secondary | ICD-10-CM | POA: Diagnosis present

## 2024-03-08 MED ORDER — IOHEXOL 300 MG/ML  SOLN
75.0000 mL | Freq: Once | INTRAMUSCULAR | Status: AC | PRN
  Administered 2024-03-08: 75 mL via INTRAVENOUS

## 2024-03-17 ENCOUNTER — Encounter

## 2024-03-18 ENCOUNTER — Ambulatory Visit: Payer: Self-pay | Admitting: Urology

## 2024-03-18 NOTE — Progress Notes (Signed)
 Riley Anderson,   Your recent CT scan looked fine-- very stable renal lesions that we have been watching. Nothing else needed at this time, happy to discuss more at our next appointment

## 2024-03-25 ENCOUNTER — Ambulatory Visit

## 2024-04-09 ENCOUNTER — Ambulatory Visit: Admitting: Physician Assistant

## 2024-04-22 ENCOUNTER — Ambulatory Visit

## 2024-06-17 ENCOUNTER — Ambulatory Visit: Admitting: Urology
